# Patient Record
Sex: Female | Born: 1950 | Race: White | Hispanic: No | Smoking: Never smoker
Health system: Southern US, Academic
[De-identification: ages and names within clinical notes are randomized; demographics above are authoritative.]

## PROBLEM LIST (undated history)

## (undated) DIAGNOSIS — I10 Essential (primary) hypertension: Secondary | ICD-10-CM

## (undated) HISTORY — PX: HX KNEE SURGERY: 2100001320

---

## 1995-02-01 ENCOUNTER — Other Ambulatory Visit (HOSPITAL_COMMUNITY): Payer: Self-pay

## 2021-08-06 ENCOUNTER — Other Ambulatory Visit (HOSPITAL_COMMUNITY): Payer: Self-pay | Admitting: Family Medicine

## 2021-08-06 DIAGNOSIS — R410 Disorientation, unspecified: Secondary | ICD-10-CM

## 2021-08-31 IMAGING — MR MRI BRAIN W/O CONTRAST
8 of 9 series · 36 of 48 positions shown · IV contrast (gadolinium)
Comparison: None available.

﻿EXAM:  MRI BRAIN W/O CONTRAST
INDICATION: Headaches, confusion and disorientation.
TECHNIQUE: Multiplanar, multisequential MRI of the brain was performed without gadolinium contrast.

[Series 5: DWI · axial · 5.0mm · 1.35mm/px · z∈[-35,+85]mm · 10 of 87 slices shown (1 of 3)]
[im 6/87]
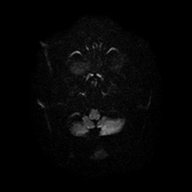
[im 12/87]
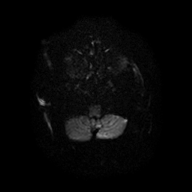
[im 18/87]
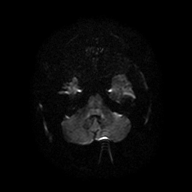
[im 29/87]
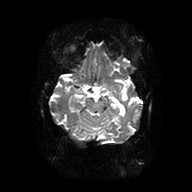
[im 41/87]
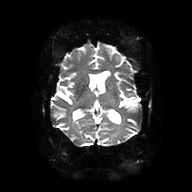
[im 46/87]
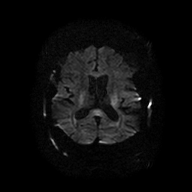
[im 52/87]
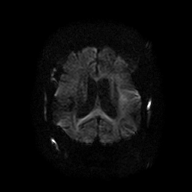
[im 64/87]
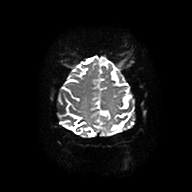
[im 75/87]
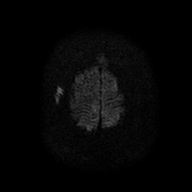
[im 87/87]
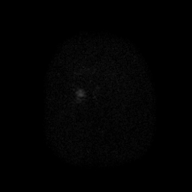

[Series 6: DWI · axial · 5.0mm · 1.35mm/px · z∈[-41,+85]mm · 4 of 22 slices shown (2 of 3)]
[im 1/22]
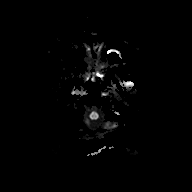
[im 8/22]
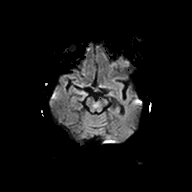
[im 15/22]
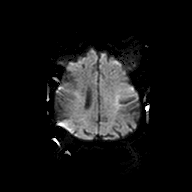
[im 22/22]
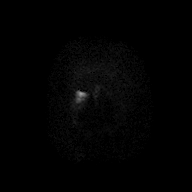

[Series 7: DWI · axial · 5.0mm · 1.35mm/px · z∈[-41,+85]mm · 4 of 22 slices shown (3 of 3)]
[im 1/22]
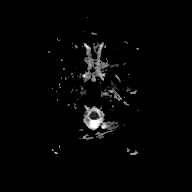
[im 8/22]
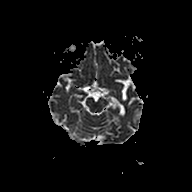
[im 15/22]
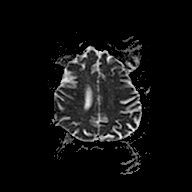
[im 22/22]
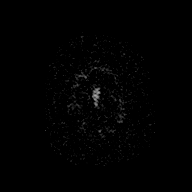

[Series 8: FLAIR · sagittal · 4.0mm · 0.75mm/px · 4 of 26 slices shown (1 of 2)]
[im 1/26]
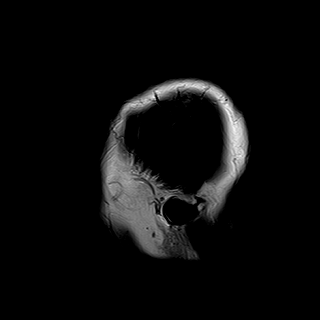
[im 9/26]
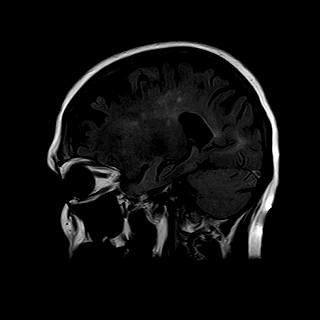
[im 17/26]
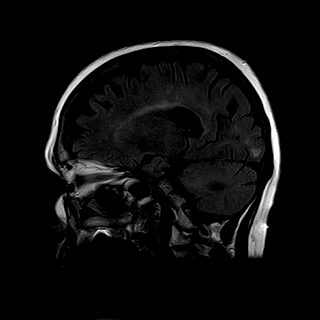
[im 26/26]
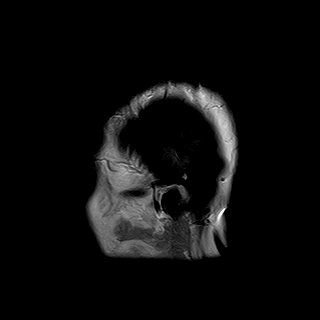

[Series 9: T2 · axial · 5.0mm · 0.43mm/px · z∈[-53,+91]mm · 4 of 25 slices shown (1 of 2)]
[im 1/25]
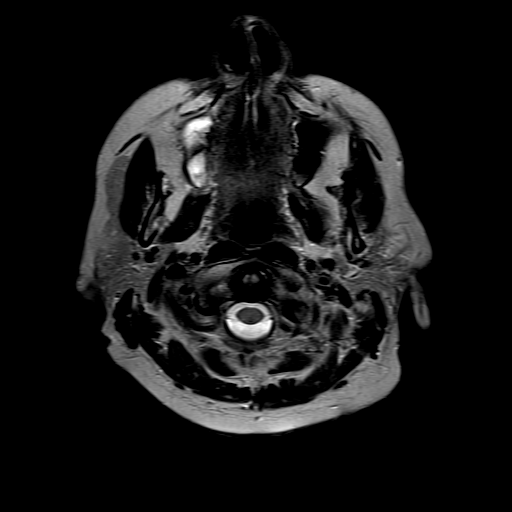
[im 9/25]
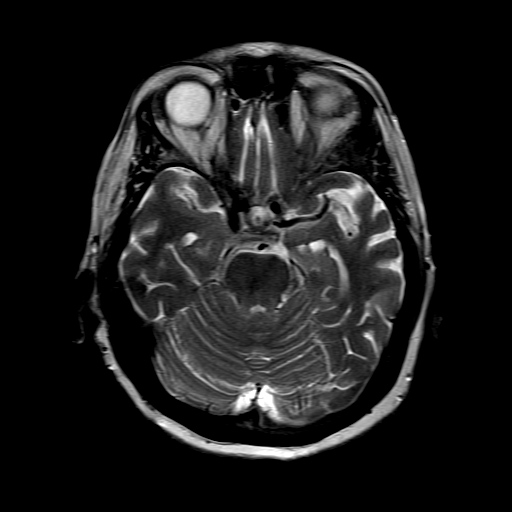
[im 17/25]
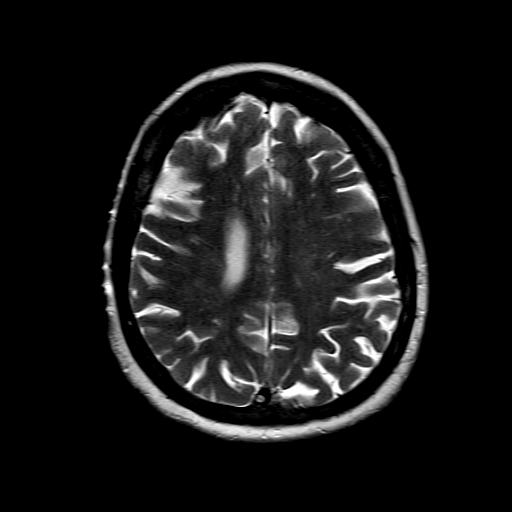
[im 25/25]
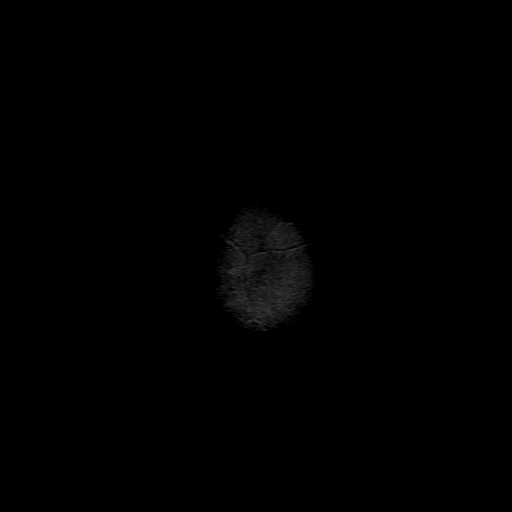

[Series 10: FLAIR · axial · 5.0mm · 0.43mm/px · z∈[-53,+91]mm · 4 of 25 slices shown (2 of 2)]
[im 1/25]
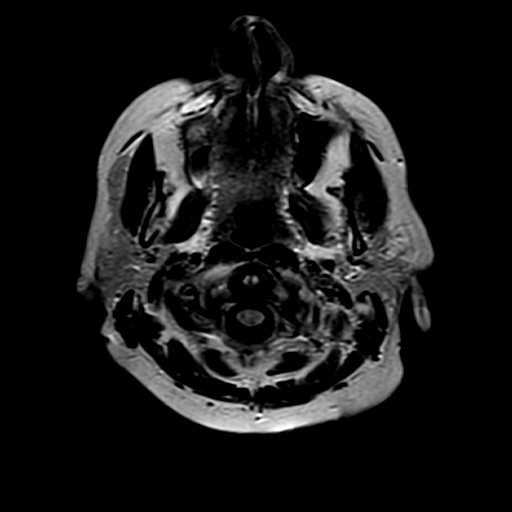
[im 9/25]
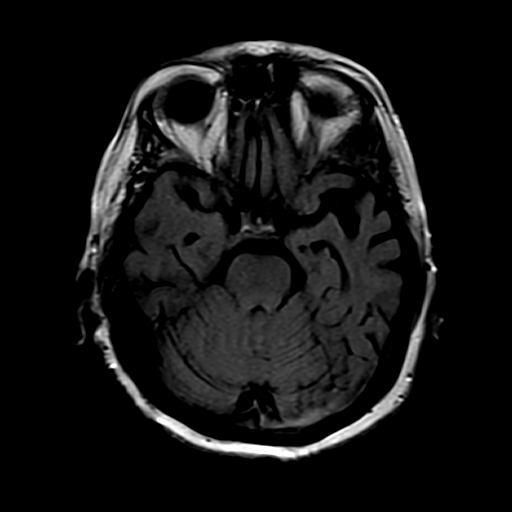
[im 17/25]
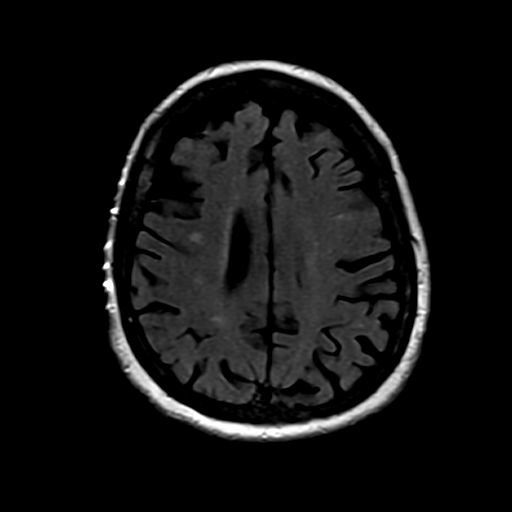
[im 25/25]
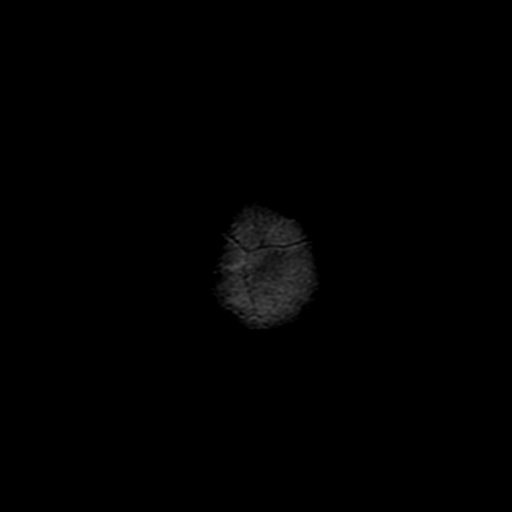

[Series 11: T1 · axial · 5.0mm · 0.43mm/px · z∈[-53,-5]mm · 2 of 25 slices shown]
[im 1/25]
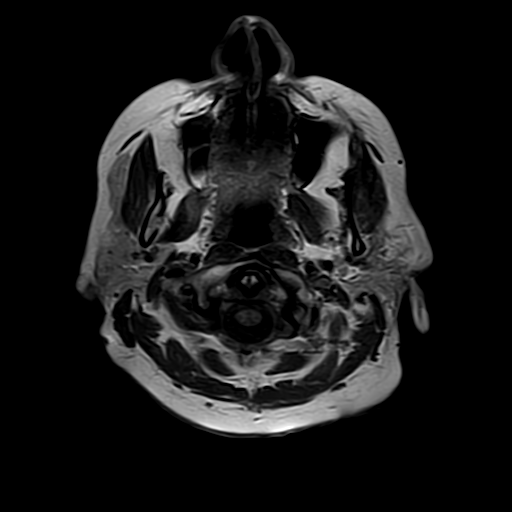
[im 9/25]
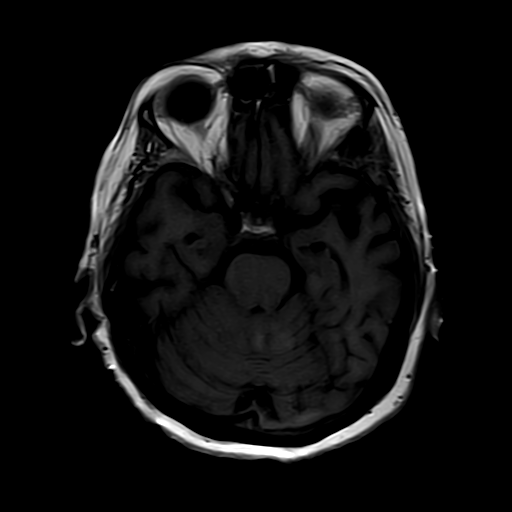

[Series 13: T2 · coronal · 6.0mm · 0.43mm/px · 4 of 24 slices shown (2 of 2)]
[im 1/24]
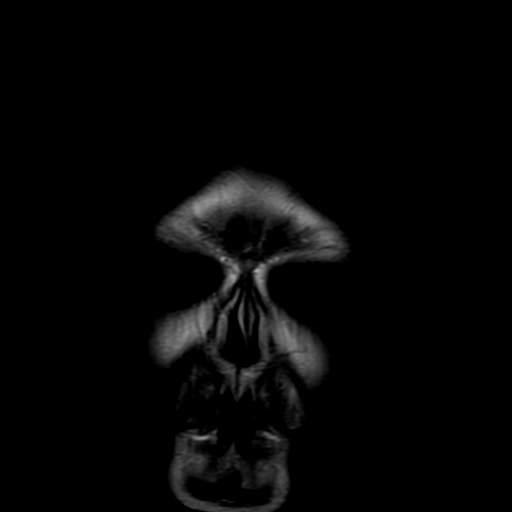
[im 8/24]
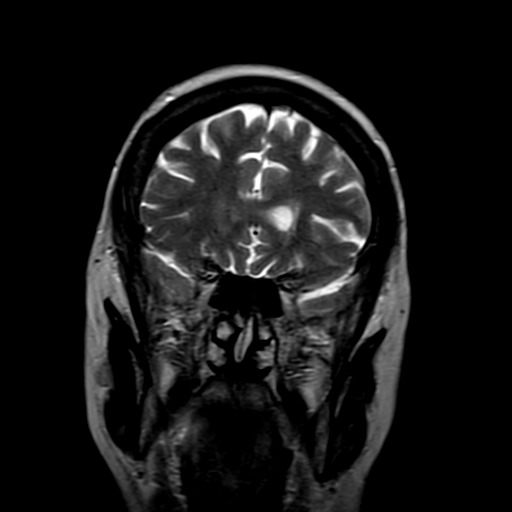
[im 16/24]
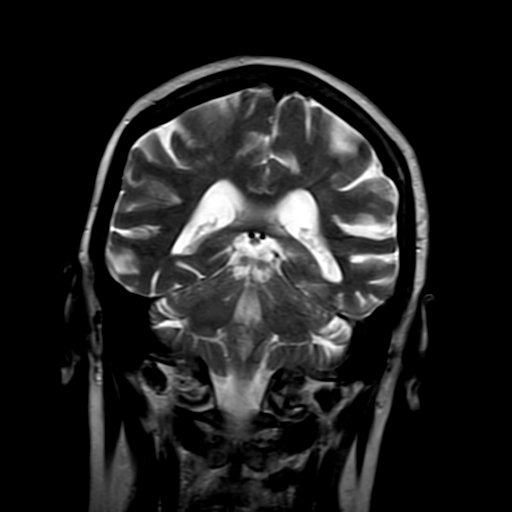
[im 24/24]
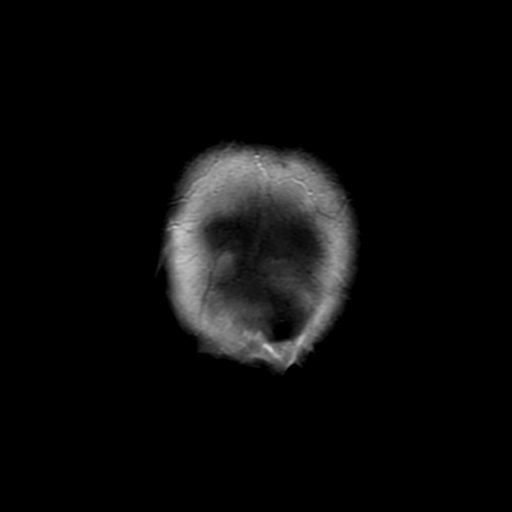

[36 of 48 positions shown; findings below may reference images not displayed]

FINDINGS: Ventricular and sulcal size is normal for the patient's age.  There are mild chronic small vessel ischemic changes. Small right occipital lobe chronic infarct is also seen.  There is no mass effect, midline shift or intracranial hemorrhage. There is no evidence of acute infarction or prior microhemorrhages. Skull base flow voids and basal cisterns are patent. Sagittal survey of midline structures is unremarkable. There are no extra-axial fluid collections.  Right maxillary and bilateral ethmoid sinus mucoperiosteal thickening is seen.  There is also partial opacification of the right mastoid air cells. Orbital contents are unremarkable.
IMPRESSION: Mild chronic small vessel ischemic changes, no acute intracranial abnormality.

## 2021-10-01 ENCOUNTER — Other Ambulatory Visit (HOSPITAL_COMMUNITY): Payer: Self-pay | Admitting: Family Medicine

## 2021-10-01 DIAGNOSIS — Z1239 Encounter for other screening for malignant neoplasm of breast: Secondary | ICD-10-CM

## 2021-11-12 ENCOUNTER — Inpatient Hospital Stay
Admission: EM | Admit: 2021-11-12 | Discharge: 2021-11-14 | DRG: 065 | Disposition: A | Discharge status: Home / Self Care | Payer: Commercial Managed Care - PPO | Attending: HOSPITALIST | Admitting: HOSPITALIST

## 2021-11-12 ENCOUNTER — Emergency Department (HOSPITAL_COMMUNITY): Payer: Commercial Managed Care - PPO

## 2021-11-12 ENCOUNTER — Other Ambulatory Visit: Payer: Self-pay

## 2021-11-12 ENCOUNTER — Inpatient Hospital Stay (HOSPITAL_COMMUNITY): Payer: Commercial Managed Care - PPO

## 2021-11-12 ENCOUNTER — Telehealth (HOSPITAL_BASED_OUTPATIENT_CLINIC_OR_DEPARTMENT_OTHER): Payer: Commercial Managed Care - PPO | Admitting: Neurology

## 2021-11-12 ENCOUNTER — Encounter (HOSPITAL_COMMUNITY): Payer: Self-pay | Admitting: Emergency Medicine

## 2021-11-12 DIAGNOSIS — R4701 Aphasia: Secondary | ICD-10-CM | POA: Diagnosis present

## 2021-11-12 DIAGNOSIS — I6389 Other cerebral infarction: Principal | ICD-10-CM | POA: Diagnosis present

## 2021-11-12 DIAGNOSIS — Z79899 Other long term (current) drug therapy: Secondary | ICD-10-CM

## 2021-11-12 DIAGNOSIS — R7303 Prediabetes: Secondary | ICD-10-CM | POA: Diagnosis present

## 2021-11-12 DIAGNOSIS — R410 Disorientation, unspecified: Secondary | ICD-10-CM

## 2021-11-12 DIAGNOSIS — I639 Cerebral infarction, unspecified: Secondary | ICD-10-CM

## 2021-11-12 DIAGNOSIS — N390 Urinary tract infection, site not specified: Secondary | ICD-10-CM | POA: Diagnosis present

## 2021-11-12 DIAGNOSIS — G459 Transient cerebral ischemic attack, unspecified: Secondary | ICD-10-CM

## 2021-11-12 DIAGNOSIS — I10 Essential (primary) hypertension: Secondary | ICD-10-CM | POA: Diagnosis present

## 2021-11-12 DIAGNOSIS — R29810 Facial weakness: Secondary | ICD-10-CM | POA: Diagnosis present

## 2021-11-12 DIAGNOSIS — G8324 Monoplegia of upper limb affecting left nondominant side: Secondary | ICD-10-CM | POA: Diagnosis present

## 2021-11-12 DIAGNOSIS — R4781 Slurred speech: Secondary | ICD-10-CM

## 2021-11-12 HISTORY — DX: Essential (primary) hypertension: I10

## 2021-11-12 LAB — CBC WITH DIFF
BASOPHIL #: 0 10*3/uL (ref 0.00–0.30)
BASOPHIL %: 1 % (ref 0–3)
EOSINOPHIL #: 0.1 10*3/uL (ref 0.00–0.80)
EOSINOPHIL %: 1 % (ref 0–7)
HCT: 41 % (ref 37.0–47.0)
HGB: 13.6 g/dL (ref 12.5–16.0)
LYMPHOCYTE #: 3.3 10*3/uL (ref 1.10–5.00)
LYMPHOCYTE %: 41 % (ref 25–45)
MCH: 29.6 pg (ref 27.0–32.0)
MCHC: 33.2 g/dL (ref 32.0–36.0)
MCV: 89.3 fL (ref 78.0–99.0)
MONOCYTE #: 0.6 10*3/uL (ref 0.00–1.30)
MONOCYTE %: 8 % (ref 0–12)
MPV: 8.7 fL (ref 7.4–10.4)
NEUTROPHIL #: 3.9 10*3/uL (ref 1.80–8.40)
NEUTROPHIL %: 49 % (ref 40–76)
PLATELETS: 207 10*3/uL (ref 140–440)
RBC: 4.6 10*6/uL (ref 4.20–5.40)
RDW: 14.4 % (ref 11.6–14.8)
WBC: 8 10*3/uL (ref 4.0–10.5)
WBCS UNCORRECTED: 8 10*3/uL

## 2021-11-12 LAB — COMPREHENSIVE METABOLIC PANEL, NON-FASTING
ALBUMIN/GLOBULIN RATIO: 1.3 (ref 0.8–1.4)
ALBUMIN: 4.1 g/dL (ref 3.5–5.7)
ALKALINE PHOSPHATASE: 146 U/L — ABNORMAL HIGH (ref 34–104)
ALT (SGPT): 17 U/L (ref 7–52)
ANION GAP: 7 mmol/L — ABNORMAL LOW (ref 10–20)
AST (SGOT): 19 U/L (ref 13–39)
BILIRUBIN TOTAL: 0.4 mg/dL (ref 0.3–1.2)
BUN/CREA RATIO: 15 (ref 6–22)
BUN: 14 mg/dL (ref 7–25)
CALCIUM, CORRECTED: 8.8 mg/dL — ABNORMAL LOW (ref 8.9–10.8)
CALCIUM: 8.9 mg/dL (ref 8.6–10.3)
CHLORIDE: 104 mmol/L (ref 98–107)
CO2 TOTAL: 27 mmol/L (ref 21–31)
CREATININE: 0.93 mg/dL (ref 0.60–1.30)
ESTIMATED GFR: 67 mL/min/{1.73_m2} (ref >59)
GLOBULIN: 3.2 (ref 2.9–5.4)
GLUCOSE: 109 mg/dL (ref 74–109)
OSMOLALITY, CALCULATED: 277 mOsm/kg (ref 270–290)
POTASSIUM: 4.1 mmol/L (ref 3.5–5.1)
PROTEIN TOTAL: 7.3 g/dL (ref 6.4–8.9)
SODIUM: 138 mmol/L (ref 136–145)

## 2021-11-12 LAB — BLUE TOP TUBE

## 2021-11-12 LAB — URINALYSIS, MACROSCOPIC
BILIRUBIN: NEGATIVE mg/dL
BLOOD: NEGATIVE mg/dL
GLUCOSE: NEGATIVE mg/dL
KETONES: NEGATIVE mg/dL
LEUKOCYTES: 250 WBCs/uL — AB
NITRITE: NEGATIVE
PH: 6.5 (ref 5.0–9.0)
PROTEIN: NEGATIVE mg/dL
SPECIFIC GRAVITY: 1.008 (ref 1.002–1.030)
UROBILINOGEN: NORMAL mg/dL

## 2021-11-12 LAB — URINALYSIS, MICROSCOPIC
BACTERIA: NEGATIVE /hpf
SQUAMOUS EPITHELIAL: 2 /hpf (ref <28)
WBCS: 3 /hpf (ref <6)

## 2021-11-12 LAB — GRAY TOP TUBE

## 2021-11-12 LAB — GOLD TOP TUBE

## 2021-11-12 LAB — LAVENDER TOP TUBE

## 2021-11-12 LAB — LIGHT GREEN TOP TUBE

## 2021-11-12 MED ORDER — ATORVASTATIN 40 MG TABLET
ORAL_TABLET | ORAL | Status: AC
Start: 2021-11-12 — End: 2021-11-12
  Filled 2021-11-12: qty 1

## 2021-11-12 MED ORDER — ATORVASTATIN 40 MG TABLET
40.0000 mg | ORAL_TABLET | Freq: Every evening | ORAL | Status: DC
Start: 2021-11-12 — End: 2021-11-14
  Administered 2021-11-12 – 2021-11-13 (×2): 40 mg via ORAL
  Filled 2021-11-12: qty 1

## 2021-11-12 MED ORDER — ASPIRIN 325 MG TABLET
325.0000 mg | ORAL_TABLET | Freq: Every day | ORAL | Status: DC
Start: 2021-11-13 — End: 2021-11-13
  Administered 2021-11-13: 325 mg via GASTROSTOMY
  Filled 2021-11-12: qty 1

## 2021-11-12 MED ORDER — ALTEPLASE 100 MG INTRAVENOUS SOLUTION
INTRAVENOUS | Status: AC
Start: 2021-11-12 — End: 2021-11-12
  Filled 2021-11-12: qty 100

## 2021-11-12 MED ORDER — SODIUM CHLORIDE 0.9 % (FLUSH) INJECTION SYRINGE
3.0000 mL | INJECTION | INTRAMUSCULAR | Status: DC | PRN
Start: 2021-11-12 — End: 2021-11-14

## 2021-11-12 MED ORDER — SODIUM CHLORIDE 0.9 % (FLUSH) INJECTION SYRINGE
3.0000 mL | INJECTION | Freq: Three times a day (TID) | INTRAMUSCULAR | Status: DC
Start: 2021-11-12 — End: 2021-11-14
  Administered 2021-11-12: 3 mL
  Administered 2021-11-13: 0 mL
  Administered 2021-11-13 (×2): 3 mL
  Administered 2021-11-14: 0 mL

## 2021-11-12 NOTE — ED Triage Notes (Signed)
Reports that she has been experiencing left sided weakness with slurred and repetitive speech. Reports initial onset of symptoms was 1640 this afternoon.

## 2021-11-12 NOTE — Consults (Signed)
Telestroke Consult Note    Kimberly, Pruitt 71 y.o. female  MRN:  [id number]     Date of Admission:  (Not on file)  Date of Birth:  [birth year]  Date of Service:  11/12/2021    Patient/Family consent for Telemedicine- Yes  Location of Consultation:  [facility], [address] [place], [place] [address]  Telestroke Consult Requested by Dr. Joseph Art  Time of Call: 17:17  Time of Return Call: 17:19  Video Connection: Yes  Technical Issues: No      Chief Complaint: Slurred speech and left arm weakness    History of Present Illness:  Kimberly Pruitt is a 71 y.o. female with a history of HTN, HLD who presents to the outside facility due to acute onset of slurred speech and left arm weakness. The patient reports the symptoms were sudden onset at 4:30 PM today, but by this time they have completely resolved. She does report a history of HTN and had run out of medicine recently so suspects her blood pressure was running higher than normal. She has no prior history of stroke and is not on home blood thinners.     Last Known Well: 11/12/21 at 16:30    PHYSICAL EXAMINATION (indirectly performed by observation via teleconference)  The patient is alert and oriented. Question of very subtle left facial droop. No focal weakness or sensory deficits are appreciated.   NIHSS (approximate)= 1        BP: 159/91  Modified Rankin: 0    I personally reviewed the following images.  My findings and interpretations are as follows:  MRI brain : Small acute ischemic stroke, right parietal lobe. Chronic white matter ischemic changes. No evidence of hemorrhage.     Assessment:    1.  Ischemic:  Yes    Patient does have thrombolytic contraindications: rapidly resolving symptoms    Recommendations:  1. IV thrombolytic administration:  No  2. CTA/ Endovascular Evaluation:  no  3.   Transfer for higher level of care:  No    Acute Ischemic Stroke  - patient was in window for thrombolytics, however she agreed to defer administration as symptoms were  nearly resolved at the time of our discussion  - carotid duplexes have been completed, results pending  - recommend TTE with shunt study  - obtain HbA1c, Lipid panel, TSH, AST/ALT  - please start ASA 81 mg daily   - please start atorvastatin 40 mg daily or equivalent intensity statin  - obtain PT/OT and speech/swallow evaluations  - I will be available for follow up tele-consultation if there are any additional questions    Total Consult Time in Minutes: 30    Hillery Hunter, MD  11/12/2021, 20:20

## 2021-11-12 NOTE — H&P (Signed)
[place]    HOSPITALIST H&P    CHRSTINA MOENCH 71 y.o. female ED21/ED21   Date of Service: 11/12/2021    Date of Admission:  11/12/2021   PCP: Peri Maris, DO Code Status:No Order       Chief Complaint: confusion, L arm weakness, facial drooping      HPI:  This 71 year old white female with known history of hypertension, presents hospital service to the ED with left-sided facial drooping, expressive aphasia, difficulty using the left arm, and confusion earlier today that apparently lasted less than 5 minutes.  She did not have any dizziness, headache, change in vision, hearing, taste, or smell.  She states she did not have any numbness or tingling in her arm, but just felt "that it was somebody else's arm".  She did state that her friend told her that her mouth was drooping, but that has since resolved.  She came to ED as a stroke alert, and did go straight MRI, as apparently the CT scanner was down to power outage.  MRI did show: SMALL SUBTLE FOCUS OF RESTRICTED DIFFUSION IN THE RIGHT ANTERIOR PARIETAL WHITE MATTER CONSISTENT WITH AN ACUTE OR SUBACUTE INFARCT.  Patient did have tele neurology visits, they did recommend admission, neuro checks, telemetry monitoring, antiplatelet agent, and statin.  Patient is asymptomatic at this time, but is willing to stay.  She will have echocardiogram, carotid duplex ultrasound, lipid profile in a.m.[name]  She will have neuro checks throughout the night.      ED medications:          PMHx:    Past Medical History:   Diagnosis Date   . Essential hypertension     PSHx:   Past Surgical History:   Procedure Laterality Date   . HX KNEE SURGERY         Allergies:    No Known Allergies Social History  Social History     Tobacco Use   . Smoking status: Never   . Smokeless tobacco: Never   Substance Use Topics   . Alcohol use: Never   . Drug use: Never       Family History  Family Medical History:     Problem Relation (Age of Onset)    Stroke Mother, Sister              Home Meds:      Prior to Admission medications    Not on File          ROS:   General: No fever or chills. No weight changes, fatigue, weakness.   HEENT: No headaches, dizziness, changes in vision, changes in hearing, or difficulty swallowing.    Skin:  No rashes, erythema or bruises.   Cardiac: No chest pain, palpitations, or arrhythmia.    Respiratory: No shortness of breath, cough, or wheezing.  GI: No nausea or vomiting. No abdominal pain.   Urinary: No dysuria, hematuria, or change in frequency.    Vascular: No edema.     Musculoskeletal: No muscle weakness, pain, or decreased range of motion.   Neurologic:  Left facial drooping, weakness left arm, confusion as per HPI   Endocrine: No heat or cold intolerance or polydipsia.   Psychiatric: No insomnia, depression or anxiety.      Results for orders placed or performed during the hospital encounter of 11/12/21 (from the past 24 hour(s))   COMPREHENSIVE METABOLIC PANEL, NON-FASTING   Result Value Ref Range    SODIUM 138  136 - 145 mmol/L    POTASSIUM 4.1 3.5 - 5.1 mmol/L    CHLORIDE 104 98 - 107 mmol/L    CO2 TOTAL 27 21 - 31 mmol/L    ANION GAP 7 (L) 10 - 20 mmol/L    BUN 14 7 - 25 mg/dL    CREATININE 0.93 0.60 - 1.30 mg/dL    BUN/CREA RATIO 15 6 - 22    ESTIMATED GFR 67 >59 mL/min/1.14m^2    ALBUMIN 4.1 3.5 - 5.7 g/dL    CALCIUM 8.9 8.6 - 10.3 mg/dL    GLUCOSE 109 74 - 109 mg/dL    ALKALINE PHOSPHATASE 146 (H) 34 - 104 U/L    ALT (SGPT) 17 7 - 52 U/L    AST (SGOT) 19 13 - 39 U/L    BILIRUBIN TOTAL 0.4 0.3 - 1.2 mg/dL    PROTEIN TOTAL 7.3 6.4 - 8.9 g/dL    ALBUMIN/GLOBULIN RATIO 1.3 0.8 - 1.4    OSMOLALITY, CALCULATED 277 270 - 290 mOsm/kg    CALCIUM, CORRECTED 8.8 (L) 8.9 - 10.8 mg/dL    GLOBULIN 3.2 2.9 - 5.4   CBC WITH DIFF   Result Value Ref Range    WBCS UNCORRECTED 8.0 x10^3/uL    WBC 8.0 4.0 - 10.5 x10^3/uL    RBC 4.60 4.20 - 5.40 x10^6/uL    HGB 13.6 12.5 - 16.0 g/dL    HCT 41.0 37.0 - 47.0 %    MCV 89.3 78.0 - 99.0 fL    MCH 29.6 27.0 - 32.0 pg     MCHC 33.2 32.0 - 36.0 g/dL    RDW 14.4 11.6 - 14.8 %    PLATELETS 207 140 - 440 x10^3/uL    MPV 8.7 7.4 - 10.4 fL    NEUTROPHIL % 49 40 - 76 %    LYMPHOCYTE % 41 25 - 45 %    MONOCYTE % 8 0 - 12 %    EOSINOPHIL % 1 0 - 7 %    BASOPHIL % 1 0 - 3 %    NEUTROPHIL # 3.90 1.80 - 8.40 x10^3/uL    LYMPHOCYTE # 3.30 1.10 - 5.00 x10^3/uL    MONOCYTE # 0.60 0.00 - 1.30 x10^3/uL    EOSINOPHIL # 0.10 0.00 - 0.80 x10^3/uL    BASOPHIL # 0.00 0.00 - 0.30 x10^3/uL          Physical:  Filed Vitals:    11/12/21 1714   BP: (!) 154/91   Pulse: 75   Resp: 18   Temp: 36.1 C (97 F)   SpO2: 98%      General: Patient is alert and oriented to person, place, and time. No acute distress. Communicates appropriately.   Head: Normocephalic and atraumatic.    Eyes: Pupils equally round and react to light and accommodate. Extraocular movements intact.  Conjunctiva normal. Sclerae are normal.    Nose: Nasal passages clear. Mucosa moist.    Throat: Moist oral mucosa. No erythema or exudate of the pharynx. Clear oropharynx.    Neck: Supple. No cervical lymphadenopathy or supraclavicular nodes detected. Trachea midline   Heart: Regular rate and rhythm. S1 & S2 present. No S3 or S4. No rubs, gallops, or murmurs appreciated.   Lungs: Clear to auscultation bilaterally with no wheezes or rales. Equal chest excursion.  No conversational dyspnea. No respiratory distress noted.   Abdomen: Soft, nontender, nondistended belly. Bowel sounds are present in all four quadrants. No rigidity.  No guarding.  No ascites.  Extremities: No edema, cyanosis, or clubbing. Grossly moves all extremities.    Skin: Warm and dry without lesions. No ecchymosis noted.    Neurologic: Cranial nerves II through XII are grossly intact.Strength 5/5 in upper extremities and lower extremities bilaterally.  Finger-to-nose intact.  No pronator drift.  Genitourinary:  No urinary incontinence or Foley catheter   Psychiatric: Judgment and insight are intact. Mood and affect are  appropriate for the situation.       Diagnostic studies:  No results found.       EKG interpretation:     @PEVF @    Assessment:  Active Hospital Problems   (*Primary Problem)    Diagnosis   . *Stroke (cerebrum) (CMS [facility])       Plan:  Patient will be admitted for the above problems.  Patient will have low-dose aspirin started, statin, and we have asked for echocardiogram, carotid ultrasound, and lipid profile for a.m.[name]  She will have q.4 hours neuro checks, and further interventions will be based on clinical course.  Vital signs are stable at time of examination, however we will allow for permissive hypertension at this time.  The hospitalist has examined patient, and reviewed all material, and agrees with the above medical management at this time.      DVT prophylaxis:  ASA     Annamarie Dawley, PA-C    [facility] HOSPITAL[facility]

## 2021-11-12 NOTE — ED Provider Notes (Signed)
[facility]  ED Primary Provider Note  Patient Name: Kimberly Pruitt  Patient Age: 71 y.o.  Date of Birth: Jul 18, 1950    Chief Complaint: Stroke Alert and Confusion        History of Present Illness       Kimberly Pruitt is a 71 y.o. female who had concerns including Stroke Alert and Confusion.  This patient is a 71 year old female who presents with reported difficulty with word-finding, slurred speech, and confusion.  The onset was approximally 4:30 p.m..  The patient is not hypoglycemic per EMS.        Review of Systems     No other overt Review of Systems are noted to be positive except noted in the HPI.      Historical Data   History Reviewed This Encounter: Medical History  Surgical History  Family History  Social History        Physical Exam   ED Triage Vitals [11/12/21 1714]   BP (Non-Invasive) (!) 154/91   Heart Rate 75   Respiratory Rate 18   Temperature 36.1 C (97 F)   SpO2 98 %   Weight 81.6 kg (180 lb)   Height 1.676 m ('5\' 6"'$ )         Nursing notes reviewed for what could be assessed. Past Medical, Surgical, and Social history reviewed. Exam limited in the setting of personal protective equipment.    Constitutional: NAD. Well-Developed. Well Nourished.  Head: Normocephalic, atraumatic.  Mouth/Throat:  Grossly symmetric facial movement.  Eyes: EOM grossly intact, conjunctiva normal.  Neck: Supple  Cardiovascular: Extremities well perfused.  Non tachycardic.  Pulmonary/Chest: No respiratory distress.   Abdominal: Non-distended. No overt peritoneal findings.   MSK: No Lower Extremity Edema.  Skin: Warm, dry, and intact for what is visualized.   Neuro: Appropriate, cranial nerves 2-12 grossly intact.  Pupils appear equal.  No pronator drift.  Patient touches her forehead bilaterally when asked to conduct finger-to-nose testing.  Psych: Pleasant            Procedures      Patient Data     Labs Ordered/Reviewed   COMPREHENSIVE METABOLIC PANEL, NON-FASTING - Abnormal; Notable  for the following components:       Result Value    ANION GAP 7 (*)     ALKALINE PHOSPHATASE 146 (*)     CALCIUM, CORRECTED 8.8 (*)     All other components within normal limits    Narrative:     Estimated Glomerular Filtration Rate (eGFR) is calculated using the CKD-EPI (2021) equation, intended for patients 20 years of age and older. If gender is not documented or "unknown", there will be no eGFR calculation.   CBC/DIFF    Narrative:     The following orders were created for panel order CBC/DIFF.  Procedure                               Abnormality         Status                     ---------                               -----------         ------  CBC WITH IRJJ[[id number]]                                    Final result                 Please view results for these tests on the individual orders.   CBC WITH DIFF   URINALYSIS, MACROSCOPIC AND MICROSCOPIC W/CULTURE REFLEX    Narrative:     The following orders were created for panel order URINALYSIS, MACROSCOPIC AND MICROSCOPIC W/CULTURE REFLEX.  Procedure                               Abnormality         Status                     ---------                               -----------         ------                     URINALYSIS, MACROSCOPIC[[id number]]                                                     URINALYSIS, MICROSCOPIC[[id number]]                                                       Please view results for these tests on the individual orders.   URINALYSIS, MACROSCOPIC   URINALYSIS, MICROSCOPIC   EXTRA TUBES    Narrative:     The following orders were created for panel order EXTRA TUBES.  Procedure                               Abnormality         Status                     ---------                               -----------         ------                     BLUE TOP KZSW[[id number]]                                    In process                 GOLD TOP DUKG[[id number]]                                    In process  LIGHT  GREEN TOP YIFO[[id number]]                             In process                 LAVENDER TOP MVEH[[id number]]                                In process                 GRAY TOP EZMO[[id number]]                                    In process                   Please view results for these tests on the individual orders.   BLUE TOP TUBE   GOLD TOP TUBE   LIGHT GREEN TOP TUBE   LAVENDER TOP TUBE   GRAY TOP TUBE   PERFORM POC WHOLE BLOOD GLUCOSE       XR AP MOBILE CHEST   Final Result by Edi, Radresults In (06/02 1836)   NO ACUTE FINDINGS.         Radiologist location ID: [id number]         MRI BRAIN WO CONTRAST   Final Result by Edi, Radresults In (06/02 1828)   1.SMALL SUBTLE FOCUS OF RESTRICTED DIFFUSION IN THE RIGHT ANTERIOR PARIETAL WHITE MATTER CONSISTENT WITH AN ACUTE OR SUBACUTE INFARCT.   2.NO ACUTE HEMORRHAGE   3.CHRONIC MICROVASCULAR CHANGES IN THE CEREBRAL WHITE MATTER WITH MILD CEREBRAL VOLUME LOSS            Radiologist location ID: [facility] Decision Making          MDM      Studies Assessed:  EKG, lab, radiology    EKG:   This EKG interpreted by me shows:    Rate:  69 per minute    Interpretation:  PR 130, insert a STEMI      MDM Narrative:  This patient is a 71 year old female who presents as a stroke alert.  This patient has reported slurred speech, and difficulty with finger-to-nose.  Due to CT scan not being functional, after discussion with Dr. Lowell Guitar common neurologist on-call common emergent MRI was ordered.  Patient does have findings suggestive of stroke on MRI, however after the scan was completed, the patient's symptoms did resolve.  After the neurologist had risk benefit discussion, it was decided to not proceed with tPA.  Hospitalist team was consulted for admission evaluation.       ED Course as of 11/12/21 1916   Fri Nov 12, 2021   1915 Case discussed with Drue Stager, he will evaluate the patient.              Patient will be admitted to the  service for further  workup and management.    Disposition: Admitted               Clinical Impression   Cerebrovascular accident (CVA), unspecified mechanism (CMS [facility]) (Primary)         There are no discharge medications for this patient.        Wynetta Emery, MD, [facility]  Department of Emergency Medicine

## 2021-11-12 NOTE — ED Nurses Note (Signed)
TeleMed set up in pt room.

## 2021-11-13 ENCOUNTER — Encounter (HOSPITAL_COMMUNITY): Payer: Self-pay | Admitting: Internal Medicine

## 2021-11-13 ENCOUNTER — Inpatient Hospital Stay (HOSPITAL_COMMUNITY): Payer: Commercial Managed Care - PPO

## 2021-11-13 DIAGNOSIS — I639 Cerebral infarction, unspecified: Secondary | ICD-10-CM

## 2021-11-13 DIAGNOSIS — R531 Weakness: Secondary | ICD-10-CM

## 2021-11-13 DIAGNOSIS — I10 Essential (primary) hypertension: Secondary | ICD-10-CM

## 2021-11-13 DIAGNOSIS — Z7982 Long term (current) use of aspirin: Secondary | ICD-10-CM

## 2021-11-13 DIAGNOSIS — N390 Urinary tract infection, site not specified: Secondary | ICD-10-CM

## 2021-11-13 LAB — ECG 12 LEAD
Atrial Rate: 69 {beats}/min
Calculated P Axis: 35 degrees
Calculated R Axis: -27 degrees
Calculated T Axis: 34 degrees
PR Interval: 130 ms
QRS Duration: 84 ms
QT Interval: 392 ms
QTC Calculation: 420 ms
Ventricular rate: 69 {beats}/min

## 2021-11-13 LAB — COMPREHENSIVE METABOLIC PANEL, NON-FASTING
ALBUMIN/GLOBULIN RATIO: 1.2 (ref 0.8–1.4)
ALBUMIN: 4 g/dL (ref 3.5–5.7)
ALKALINE PHOSPHATASE: 112 U/L — ABNORMAL HIGH (ref 34–104)
ALT (SGPT): 16 U/L (ref 7–52)
ANION GAP: 6 mmol/L — ABNORMAL LOW (ref 10–20)
AST (SGOT): 19 U/L (ref 13–39)
BILIRUBIN TOTAL: 0.9 mg/dL (ref 0.3–1.2)
BUN/CREA RATIO: 14 (ref 6–22)
BUN: 10 mg/dL (ref 7–25)
CALCIUM, CORRECTED: 9.3 mg/dL (ref 8.9–10.8)
CALCIUM: 9.3 mg/dL (ref 8.6–10.3)
CHLORIDE: 105 mmol/L (ref 98–107)
CO2 TOTAL: 30 mmol/L (ref 21–31)
CREATININE: 0.74 mg/dL (ref 0.60–1.30)
ESTIMATED GFR: 88 mL/min/{1.73_m2} (ref >59)
GLOBULIN: 3.3 (ref 2.9–5.4)
GLUCOSE: 124 mg/dL — ABNORMAL HIGH (ref 74–109)
OSMOLALITY, CALCULATED: 282 mOsm/kg (ref 270–290)
POTASSIUM: 4.1 mmol/L (ref 3.5–5.1)
PROTEIN TOTAL: 7.3 g/dL (ref 6.4–8.9)
SODIUM: 141 mmol/L (ref 136–145)

## 2021-11-13 LAB — PHOSPHORUS: PHOSPHORUS: 3.4 mg/dL — ABNORMAL LOW (ref 3.7–7.2)

## 2021-11-13 LAB — LIPID PANEL
CHOL/HDL RATIO: 3.4
CHOLESTEROL: 205 mg/dL — ABNORMAL HIGH (ref <200)
HDL CHOL: 61 mg/dL (ref 23–92)
LDL CALC: 125 mg/dL — ABNORMAL HIGH (ref 0–100)
TRIGLYCERIDES: 97 mg/dL (ref <=150)
VLDL CALC: 19 mg/dL (ref 0–50)

## 2021-11-13 LAB — THYROID STIMULATING HORMONE (SENSITIVE TSH): TSH: 1.894 u[IU]/mL (ref 0.450–5.330)

## 2021-11-13 LAB — MAGNESIUM: MAGNESIUM: 2 mg/dL (ref 1.9–2.7)

## 2021-11-13 LAB — LAVENDER TOP TUBE

## 2021-11-13 LAB — HGA1C (HEMOGLOBIN A1C WITH EST AVG GLUCOSE): HEMOGLOBIN A1C: 6.1 % — ABNORMAL HIGH (ref 4.0–6.0)

## 2021-11-13 MED ORDER — ACETAMINOPHEN 325 MG TABLET
650.0000 mg | ORAL_TABLET | ORAL | Status: DC | PRN
Start: 2021-11-13 — End: 2021-11-14
  Administered 2021-11-13: 650 mg via ORAL
  Filled 2021-11-13: qty 2

## 2021-11-13 MED ORDER — SODIUM CHLORIDE 0.9 % INTRAVENOUS PIGGYBACK
2.0000 g | INTRAVENOUS | Status: DC
Start: 2021-11-13 — End: 2021-11-14
  Administered 2021-11-13: 2 g via INTRAVENOUS
  Administered 2021-11-13 – 2021-11-14 (×2): 0 g via INTRAVENOUS
  Administered 2021-11-14: 2 g via INTRAVENOUS
  Filled 2021-11-13 (×2): qty 20

## 2021-11-13 MED ORDER — LISINOPRIL 5 MG TABLET
5.0000 mg | ORAL_TABLET | Freq: Every day | ORAL | Status: DC
Start: 2021-11-13 — End: 2021-11-14
  Administered 2021-11-13 – 2021-11-14 (×2): 5 mg via ORAL
  Filled 2021-11-13 (×2): qty 1

## 2021-11-13 MED ORDER — SODIUM CHLORIDE 0.9 % INJECTION SOLUTION
2.0000 mL | Freq: Once | INTRAVENOUS | Status: DC | PRN
Start: 2021-11-13 — End: 2021-11-14
  Administered 2021-11-13: 4 mL via INTRAVENOUS

## 2021-11-13 MED ORDER — ASPIRIN 81 MG CHEWABLE TABLET
81.0000 mg | CHEWABLE_TABLET | Freq: Every day | ORAL | Status: DC
Start: 2021-11-14 — End: 2021-11-14
  Administered 2021-11-14: 81 mg via ORAL
  Filled 2021-11-13: qty 1

## 2021-11-13 NOTE — ED Nurses Note (Signed)
Patient transferred to room on telemetry. I.V. access in place with saline lock. Care endorsed to floor nurse.

## 2021-11-13 NOTE — Progress Notes (Signed)
[place] MEDICINE [facility]     HOSPITALIST PROGRESS NOTE    Assessment/Plan:    Kimberly Pruitt is a 71 y.o. female who presented to [facility] with Stroke (cerebrum) (CMS HCC).      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  She remains awake, alert and oriented x4.  She has problems finding words.  Also with left upper extremity weakness with strength of 4/5.  Will keep in hospital today as we complete stroke workup which include several imaging studies and somewhat blood test.  Will attempt to optimize her BP as well.  Continue inpatient care for now.    Objective     Vital signs in last 24 hours:  Filed Vitals:    11/13/21 0556 11/13/21 0610 11/13/21 0726 11/13/21 1012   BP:  (!) 151/89 (!) 150/77    Pulse: 55 60 58 70   Resp:  15 18    Temp:  36.7 C (98 F) 36.6 C (97.9 F)    SpO2:  97% 96%        Intake/Output last 3 shifts:  No intake or output data in the 24 hours ending 11/13/21 1156     Physical Exam:    General: Well-developed, well-nourished  Cardiovascular:  Regular rate, normal rhythm, S1 and S2 present, no murmur, no gallop, no rub, normal peripheral perfusion  Respiratory: Clear to auscultation bilaterally, bilateral breath sounds, normal effort  Gastrointestinal: Soft, benign, nondistended, nontender, no mass  Extremities: No gross deformity, no cyanosis or edema  Skin: Warm, dry; no rashes or  lesions    In-Hospital Medications:    Current Facility-Administered Medications   Medication Dose Route Frequency   . acetaminophen (TYLENOL) tablet  650 mg Oral Q4H PRN   . [START ON 11/14/2021] aspirin chewable tablet 81 mg  81 mg Oral Daily   . atorvastatin (LIPITOR) tablet  40 mg Oral QPM   . cefTRIAXone (ROCEPHIN) 2 g in NS 50 mL IVPB minibag  2 g Intravenous Q24H   . NS flush syringe  3 mL Intracatheter Q8HRS   . NS flush syringe  3 mL Intracatheter Q1H PRN          Labs:    Results for orders placed or performed during the hospital encounter of 11/12/21 (from the past 24 hour(s))   COMPREHENSIVE METABOLIC PANEL, NON-FASTING    Collection Time: 11/12/21  5:15 PM   Result Value Ref Range    SODIUM 138 136 - 145 mmol/L    POTASSIUM 4.1 3.5 - 5.1 mmol/L    CHLORIDE 104 98 - 107 mmol/L    CO2 TOTAL 27 21 - 31 mmol/L    ANION GAP 7 (L) 10 - 20 mmol/L  BUN 14 7 - 25 mg/dL    CREATININE 9.67 8.93 - 1.30 mg/dL    BUN/CREA RATIO 15 6 - 22    ESTIMATED GFR 67 >59 mL/min/1.49m^2    ALBUMIN 4.1 3.5 - 5.7 g/dL    CALCIUM 8.9 8.6 - 81.0 mg/dL    GLUCOSE 175 74 - 102 mg/dL    ALKALINE PHOSPHATASE 146 (H) 34 - 104 U/L    ALT (SGPT) 17 7 - 52 U/L    AST (SGOT) 19 13 - 39 U/L    BILIRUBIN TOTAL 0.4 0.3 - 1.2 mg/dL    PROTEIN TOTAL 7.3 6.4 - 8.9 g/dL    ALBUMIN/GLOBULIN RATIO 1.3 0.8 - 1.4    OSMOLALITY, CALCULATED 277 270 - 290 mOsm/kg    CALCIUM, CORRECTED 8.8 (L) 8.9 - 10.8 mg/dL    GLOBULIN 3.2 2.9 - 5.4   CBC WITH DIFF    Collection Time: 11/12/21  5:15 PM   Result Value Ref Range    WBCS UNCORRECTED 8.0 x10^3/uL    WBC 8.0 4.0 - 10.5 x10^3/uL    RBC 4.60 4.20 - 5.40 x10^6/uL    HGB 13.6 12.5 - 16.0 g/dL    HCT 58.5 27.7 - 82.4 %    MCV 89.3 78.0 - 99.0 fL    MCH 29.6 27.0 - 32.0 pg    MCHC 33.2 32.0 - 36.0 g/dL    RDW 23.5 36.1 - 44.3 %    PLATELETS 207 140 - 440 x10^3/uL    MPV 8.7 7.4 - 10.4 fL    NEUTROPHIL % 49 40 - 76 %    LYMPHOCYTE % 41 25 - 45 %    MONOCYTE % 8 0 - 12 %    EOSINOPHIL % 1 0 - 7 %    BASOPHIL % 1 0  - 3 %    NEUTROPHIL # 3.90 1.80 - 8.40 x10^3/uL    LYMPHOCYTE # 3.30 1.10 - 5.00 x10^3/uL    MONOCYTE # 0.60 0.00 - 1.30 x10^3/uL    EOSINOPHIL # 0.10 0.00 - 0.80 x10^3/uL    BASOPHIL # 0.00 0.00 - 0.30 x10^3/uL   ECG 12 LEAD    Collection Time: 11/12/21  5:19 PM   Result Value Ref Range    Ventricular rate 69 BPM    Atrial Rate 69 BPM    PR Interval 130 ms    QRS Duration 84 ms    QT Interval 392 ms    QTC Calculation 420 ms    Calculated P Axis 35 degrees    Calculated R Axis -27 degrees    Calculated T Axis 34 degrees   BLUE TOP TUBE    Collection Time: 11/12/21  5:36 PM   Result Value Ref Range    RAINBOW/EXTRA TUBE AUTO RESULT Yes    GOLD TOP TUBE    Collection Time: 11/12/21  5:36 PM   Result Value Ref Range    RAINBOW/EXTRA TUBE AUTO RESULT Yes    LIGHT GREEN TOP TUBE    Collection Time: 11/12/21  5:36 PM   Result Value Ref Range    RAINBOW/EXTRA TUBE AUTO RESULT Yes    LAVENDER TOP TUBE    Collection Time: 11/12/21  5:36 PM   Result Value Ref Range    RAINBOW/EXTRA TUBE AUTO RESULT Yes    GRAY TOP TUBE    Collection Time: 11/12/21  5:36 PM   Result Value Ref Range    RAINBOW/EXTRA TUBE AUTO RESULT Yes  URINE CULTURE,ROUTINE    Collection Time: 11/12/21  9:40 PM    Specimen: Urine, Clean Catch   Result Value Ref Range    URINE CULTURE No Growth    URINALYSIS, MACROSCOPIC    Collection Time: 11/12/21  9:40 PM   Result Value Ref Range    COLOR Yellow Colorless, Light Yellow, Yellow    APPEARANCE Clear Clear    SPECIFIC GRAVITY 1.008 1.002 - 1.030    PH 6.5 5.0 - 9.0    LEUKOCYTES 250 (A) Negative, 100  WBCs/uL    NITRITE Negative Negative    PROTEIN Negative Negative, 10 , 20  mg/dL    GLUCOSE Negative Negative, 30  mg/dL    KETONES Negative Negative, Trace mg/dL    BILIRUBIN Negative Negative, 0.5 mg/dL    BLOOD Negative Negative, 0.03 mg/dL    UROBILINOGEN Normal Normal mg/dL   URINALYSIS, MICROSCOPIC    Collection Time: 11/12/21  9:40 PM   Result Value Ref Range    BACTERIA Negative Negative /hpf    RBCS       WBCS 3 <6 /hpf    SQUAMOUS EPITHELIAL 2 <28 /hpf   COMPREHENSIVE METABOLIC PANEL, NON-FASTING    Collection Time: 11/13/21  6:51 AM   Result Value Ref Range    SODIUM 141 136 - 145 mmol/L    POTASSIUM 4.1 3.5 - 5.1 mmol/L    CHLORIDE 105 98 - 107 mmol/L    CO2 TOTAL 30 21 - 31 mmol/L    ANION GAP 6 (L) 10 - 20 mmol/L    BUN 10 7 - 25 mg/dL    CREATININE 1.610.74 0.960.60 - 1.30 mg/dL    BUN/CREA RATIO 14 6 - 22    ESTIMATED GFR 88 >59 mL/min/1.2566m^2    ALBUMIN 4.0 3.5 - 5.7 g/dL    CALCIUM 9.3 8.6 - 04.510.3 mg/dL    GLUCOSE 409124 (H) 74 - 109 mg/dL    ALKALINE PHOSPHATASE 112 (H) 34 - 104 U/L    ALT (SGPT) 16 7 - 52 U/L    AST (SGOT) 19 13 - 39 U/L    BILIRUBIN TOTAL 0.9 0.3 - 1.2 mg/dL    PROTEIN TOTAL 7.3 6.4 - 8.9 g/dL    ALBUMIN/GLOBULIN RATIO 1.2 0.8 - 1.4    OSMOLALITY, CALCULATED 282 270 - 290 mOsm/kg    CALCIUM, CORRECTED 9.3 8.9 - 10.8 mg/dL    GLOBULIN 3.3 2.9 - 5.4   MAGNESIUM    Collection Time: 11/13/21  6:51 AM   Result Value Ref Range    MAGNESIUM 2.0 1.9 - 2.7 mg/dL   PHOSPHORUS    Collection Time: 11/13/21  6:51 AM   Result Value Ref Range    PHOSPHORUS 3.4 (L) 3.7 - 7.2 mg/dL   LIPID PANEL    Collection Time: 11/13/21  6:51 AM   Result Value Ref Range    CHOLESTEROL 205 (H) <200 mg/dL    TRIGLYCERIDES 97 <=811<=150 mg/dL    HDL CHOL 61 23 - 92 mg/dL    LDL CALC 914125 (H) 0 - 100 mg/dL    VLDL CALC 19 0 - 50 mg/dL    CHOL/HDL RATIO 3.4    THYROID STIMULATING HORMONE (SENSITIVE TSH)    Collection Time: 11/13/21  6:51 AM   Result Value Ref Range    TSH 1.894 0.450 - 5.330 uIU/mL   HGA1C (HEMOGLOBIN A1C WITH EST AVG GLUCOSE)    Collection Time: 11/13/21  7:27 AM   Result Value Ref Range  HEMOGLOBIN A1C 6.1 (H) 4.0 - 6.0 %       Micro:    Hospital Encounter on 11/12/21 (from the past 24 hour(s))   URINE CULTURE,ROUTINE    Collection Time: 11/12/21  9:40 PM    Specimen: Urine, Clean Catch   Culture Result Status    URINE CULTURE No Growth Preliminary        Imaging:    ECG 12 LEAD  Normal sinus rhythm  Low voltage  QRS  Borderline ECG  No previous ECGs available  Confirmed by Haig Prophet ([id number]) on 11/13/2021 9:47:16 AM         Audrea Muscat, MD  November 13, 2021    I personally spent 50 minutes face-to-face and non-face-to-face in the care of this patient, which includes all pre, intra, and post visit time on the date of service.  All documented time was specific to the E/M visit and does not include any procedures that may have been performed.

## 2021-11-13 NOTE — Care Plan (Signed)
Patient admitted with Stroke cerebrum. Patient is alert and oriented on room air. Weakness in left hand. Vitals assessed Q4 and PRN. Pain assessed Q2 and PRN. Labs drawn per orders. Fall precautions in place. No deficits other than weakness in left hand. Patient is scheduled to have a TEE today, and carotid ultrasound today. All questions and concerns answered at this time. Son is at bedside.     Problem: Stroke, Ischemic (Includes Transient Ischemic Attack)  Goal: Optimal Coping  Outcome: Ongoing (see interventions/notes)  Intervention: Support Psychosocial Response to Stroke  Recent Flowsheet Documentation  Taken 11/13/2021 0631 by Edwin Cap, RN  Family/Support System Care:   self-care encouraged   support provided  Supportive Measures:   active listening utilized   verbalization of feelings encouraged   self-responsibility promoted   self-reflection promoted   self-care encouraged   positive reinforcement provided  Goal: Effective Bowel Elimination  Outcome: Ongoing (see interventions/notes)  Goal: Optimal Cerebral Tissue Perfusion  Outcome: Ongoing (see interventions/notes)  Intervention: Protect and Optimize Cerebral Perfusion  Recent Flowsheet Documentation  Taken 11/13/2021 0631 by Edwin Cap, RN  Cerebral Perfusion Promotion: blood pressure monitored  Goal: Optimal Cognitive Function  Outcome: Ongoing (see interventions/notes)  Goal: Improved Communication Skills  Outcome: Ongoing (see interventions/notes)  Intervention: Teacher, adult education  Recent Flowsheet Documentation  Taken 11/13/2021 0631 by Edwin Cap, RN  Communication Enhancement Strategies:   call light answered in person   family involved in communication plan  Goal: Optimal Functional Ability  Outcome: Ongoing (see interventions/notes)  Intervention: Optimize Functional Ability  Recent Flowsheet Documentation  Taken 11/13/2021 0631 by Edwin Cap, RN  Activity Management:   activity adjusted per tolerance   activity clustered  for rest periods   activity encouraged   ROM, active encouraged   ambulated in room  Goal: Effective Oxygenation and Ventilation  Outcome: Ongoing (see interventions/notes)  Goal: Improved Sensorimotor Function  Outcome: Ongoing (see interventions/notes)  Intervention: Optimize Sensory and Perceptual Ability  Recent Flowsheet Documentation  Taken 11/13/2021 0631 by Edwin Cap, RN  Pressure Reduction Techniques: frequent weight shift encouraged  Pressure Reduction Devices: (M,H,VH) Use Repositioning Devices or Pillows  Goal: Optimal Eating and Swallowing without Aspiration  Outcome: Ongoing (see interventions/notes)  Goal: Effective Urinary Elimination  Outcome: Ongoing (see interventions/notes)     Problem: Pain Acute  Goal: Optimal Pain Control and Function  Outcome: Ongoing (see interventions/notes)  Intervention: Optimize Psychosocial Wellbeing  Recent Flowsheet Documentation  Taken 11/13/2021 0631 by Edwin Cap, RN  Supportive Measures:   active listening utilized   verbalization of feelings encouraged   self-responsibility promoted   self-reflection promoted   self-care encouraged   positive reinforcement provided     Problem: Fall Injury Risk  Goal: Absence of Fall and Fall-Related Injury  Outcome: Ongoing (see interventions/notes)  Intervention: Promote Injury-Free Environment  Recent Flowsheet Documentation  Taken 11/13/2021 0631 by Edwin Cap, RN  Safety Promotion/Fall Prevention:   activity supervised   fall prevention program maintained   nonskid shoes/slippers when out of bed   safety round/check completed

## 2021-11-13 NOTE — Care Plan (Signed)
Admit for Stroke  Treatment: Neuro assessments, vitals, Cardio monitoring.  Tolerates well    Problem: Stroke, Ischemic (Includes Transient Ischemic Attack)  Goal: Optimal Coping  Outcome: Ongoing (see interventions/notes)  Goal: Effective Bowel Elimination  Outcome: Ongoing (see interventions/notes)  Goal: Optimal Cerebral Tissue Perfusion  Outcome: Ongoing (see interventions/notes)  Goal: Optimal Cognitive Function  Outcome: Ongoing (see interventions/notes)  Goal: Improved Communication Skills  Outcome: Ongoing (see interventions/notes)  Intervention: Teacher, adult education  Recent Flowsheet Documentation  Taken 11/13/2021 0800 by Verta Ellen, RN  Communication Enhancement Strategies: call light answered in person  Goal: Optimal Functional Ability  Outcome: Ongoing (see interventions/notes)  Goal: Effective Oxygenation and Ventilation  Outcome: Ongoing (see interventions/notes)  Goal: Improved Sensorimotor Function  Outcome: Ongoing (see interventions/notes)  Goal: Optimal Eating and Swallowing without Aspiration  Outcome: Ongoing (see interventions/notes)  Goal: Effective Urinary Elimination  Outcome: Ongoing (see interventions/notes)     Problem: Pain Acute  Goal: Optimal Pain Control and Function  Outcome: Ongoing (see interventions/notes)     Problem: Pain Acute  Goal: Optimal Pain Control and Function  Outcome: Ongoing (see interventions/notes)     Problem: Fall Injury Risk  Goal: Absence of Fall and Fall-Related Injury  Outcome: Ongoing (see interventions/notes)

## 2021-11-14 DIAGNOSIS — R7303 Prediabetes: Secondary | ICD-10-CM

## 2021-11-14 LAB — URINE CULTURE,ROUTINE: URINE CULTURE: NO GROWTH

## 2021-11-14 MED ORDER — NITROFURANTOIN MONOHYDRATE/MACROCRYSTALS 100 MG CAPSULE
100.0000 mg | ORAL_CAPSULE | Freq: Two times a day (BID) | ORAL | 0 refills | Status: AC
Start: 2021-11-14 — End: 2021-11-19

## 2021-11-14 MED ORDER — ATORVASTATIN 40 MG TABLET
40.0000 mg | ORAL_TABLET | Freq: Every evening | ORAL | 0 refills | Status: AC
Start: 2021-11-14 — End: 2021-12-14

## 2021-11-14 MED ORDER — LISINOPRIL 5 MG TABLET
5.0000 mg | ORAL_TABLET | Freq: Every day | ORAL | 0 refills | Status: AC
Start: 2021-11-15 — End: 2021-12-15

## 2021-11-14 MED ORDER — ASPIRIN 81 MG CHEWABLE TABLET
81.0000 mg | CHEWABLE_TABLET | Freq: Every day | ORAL | 0 refills | Status: AC
Start: 2021-11-15 — End: 2021-12-15

## 2021-11-14 NOTE — PT Evaluation (Signed)
[facility]  [place], [address]  [phone]  [phone]  Rehabilitation Services  Physical Therapy Inpatient Initial Evaluation    Patient Name: Kimberly Pruitt  Date of Birth: 1950/10/30  Height: Height: 172.7 cm (5' 7.99")  Weight: Weight: 83.6 kg (184 lb 5 oz)  Room/Bed: 301/A  Payor: [employer] / Plan: [place] / Product Type: Non Managed Care /       PMH:  Past Medical History:   Diagnosis Date    Essential hypertension            Assessment:     Ms. Cornia demonstrates good strength, balance, ROM, and activity tolerance, and is independent with all therapy tasks. 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 She does not require skilled PT services at this time, but may benefit from an outpatient SLP evaluation. PT signing off.      Discharge Needs:     Equipment Recommendation: none anticipated    Discharge Disposition: home with assist  JUSTIFICATION OF DISCHARGE RECOMMENDATION   Based on current diagnosis, functional performance prior to admission, and current functional performance, this patient does not require continued PT services.  Due to her high level of function, PT recommends home with assist as needed.    Plan:   Current Intervention: patient/family education  To provide physical therapy services  (one time visit)  for duration of evaluation only.    The risks/benefits of therapy have been discussed with the patient/caregiver and he/she is in agreement with the established plan of care.       Subjective & Objective     Past Medical History:   Diagnosis Date    Essential hypertension      Past Surgical History:   Procedure Laterality Date    HX KNEE SURGERY          11/13/21 1706   Rehab Session   Document Type evaluation   Total PT Minutes: 19   Patient Effort excellent   Symptoms Noted During/After Treatment none   General Information   Patient Profile  Reviewed yes   Pertinent History of Current Functional Problem Patient admitted for ischemic CVA of R parietal lobe, initially with L sided weakness and slurred speech which has resolved. Minor sequencing deficits, comprehension of new tasks, and apraxia noted. Patient also with acute UTI.   Medical Lines Telemetry;PIV Line   Respiratory Status room air   General Observations of Patient Patient agreeable to PT. Patient reclined in bed upon approach, NAD, friend present in room. At departure patient seated EOB, needs/call light in reach, NAD, friend who is a PTA present.   Mutuality/Individual Preferences   Individualized Care Needs PRN assistance   Patient-Specific Goals (Include Timeframe) Go home soon   Plan of Care Reviewed With patient;friend   Living Environment   Lives With alone   Living Arrangements house   Living Environment Comment Patient lives in single story home, no STE. Bathroom has a tub/shower, standard toilet.   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 Does not have DME, but does have a Life Alert  since she is alone.   Pre Treatment Status   Pre Treatment Patient Status Nurse approved session   Support Present Pre Treatment  Sitter present  (Friend present)   Communication Pre Treatment  Nurse   Communication Pre Treatment Comment RN cleared patient for PT   Cognitive Assessment/Interventions   Behavior/Mood Observations behavior appropriate to situation, WNL/WFL   Orientation Status oriented x 4   Attention WNL/WFL   Follows Commands follows one step commands;WFL;follows multi-step commands;increased processing time needed;physical/tactile prompts required;repetition of directions required;verbal  cues/prompting required   Comment Difficulty sequencing new/unfamiliar tasks particularly with coordination testing; unable to complete heel/shin test, significant difficulty with finger/thumb opposition but other tests such as RAMS (BUE/BLE) and finger/nose were normal. When discussing her condition/history, she had word finding difficulty although speech was clear. Good ability to dual/multi-task with familiar activities including gait.   Vital Signs   Pre-Treatment Heart Rate (beats/min) 77   Post-treatment Heart Rate (beats/min) 79   Pre SpO2 (%) 94   O2 Delivery Pre Treatment room air   Post SpO2 (%) 96   O2 Delivery Post Treatment room air   Pain Assessment   Pre/Posttreatment Pain Comment Patient denied pain   Vision Assessment/Interventions   Visual Impairment/Limitations [facility]   General Extremity Assessment   Comment strength 4+>5/5 throughout; equal on both sides   RUE Assessment   RUE Assessment WFL- Within Functional Limits   LUE Assessment   LUE Assessment WFL- Within Functional Limits   RLE Assessment   RLE Assessment WFL- Within Functional Limits   LLE Assessment   LLE Assessment WFL- Within Functional Limits   Trunk Assessment   Trunk Assessment WFL-Within Functional Limits   Bed Mobility Assessment/Treatment   Supine-Sit Independence independent   Sit to Supine, Independence independent   Transfer Assessment/Treatment   Sit-Stand Independence independent   Stand-Sit Independence independent   Gait Assessment/Treatment   Total Distance Ambulated 250   Independence  independent   Gait Speed normal   Deviations    (no significant deviations; WFL)   Balance Skill Training   Comment Normal balance with all tasks   Therapeutic Exercise/Activity   Comment PT educated patient on exam findings and how that correlates to the area in the brain of the stroke. PT educated patient to be aware that some unfamiliar tasks may take extra time to complete and that she may have difficulty comprehending new information  and how to perform unfamiliar tasks. PT educated patient on safety and to contact her physician in order to obtain PT services in the future. PT advised that she could benefit from outpatient speech for her cognitive/language deficits. Patient and friend expressed understanding and all questions were answered to their satisfaction.   Post Treatment Status   Post Treatment Patient Status Patient sitting in bedside chair or w/c;Call light within reach;Telephone within reach   Support Present Post Treatment  Other (See comments)  (Friend present)   Communication Post Treatement Nurse   Communication Post Treatment Comment RN alerted of patient condition and performance with PT.   Plan of Care Review   Plan Of Care Reviewed With patient;friend   Physical Therapy Clinical Impression   Assessment Ms. Noyes demonstrates good strength, balance, ROM, and activity tolerance, and is independent with all therapy tasks. 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 She does not require skilled PT services at this time, but may benefit from an outpatient SLP evaluation. PT signing off.  Patient/Family Goals Statement "I hope to go home today. I'm ready to get back to my normal routine."   Criteria for Skilled Therapeutic no problems identified which require skilled intervention   Rehab Potential good   Therapy Frequency   (one time visit)   Predicted Duration of Therapy Intervention (days/wks) evaluation only   Anticipated Equipment Needs at Discharge (PT) none anticipated   Anticipated Discharge Disposition home with assist   Referral Needed to Another Service speech language pathology   Care Plan Goals   PT Rehab Goals Physical Therapy Goal   Physical Therapy Goal   PT  Goal, Date Established 11/13/21   PT Goal, Time to Achieve 1 day   PT Goal, Activity Type Education: Patient to express understanding of safety and to contact her physician in order to obtain any  future needed PT services.   PT Goal, Outcome goal met   Planned Therapy Interventions, PT Eval   Planned Therapy Interventions (PT) patient/family education   Physical Therapy Discharge Summary   Additional Documentation Discharge Summary (PT) (Group)   Discharge Summary, PT Eval   Reason for Discharge no further needs identified         INTERVENTION MINUTES: EVALUATION 19 minutes    EVALUATION COMPLEXITY : CLINICAL DECISION MAKING OF LOW COMPLEXITY AS INDICATED BY PMH, PHYSICAL THERAPY ASSESSMENT OF MUSCULOSKELETAL AND NEUROLOGICAL SYSTEMS AND ACTIVITY LIMITATIONS. CLINICAL PRESENTATION IS STABLE AND UNCOMPLICATED      Therapist:     Karrie Doffing PT, DPT

## 2021-11-14 NOTE — Nurses Notes (Signed)
Patient discharged home with family.  AVS reviewed with patient/care giver.  A written copy of the AVS and discharge instructions was given to the patient/care giver.  Questions sufficiently answered as needed.  Patient/care giver encouraged to follow up with PCP as indicated.  In the event of an emergency, patient/care giver instructed to call 911 or go to the nearest emergency room.

## 2021-11-14 NOTE — Discharge Summary (Signed)
Kimberly Pruitt     HOSPITALIST DISCHARGE SUMMARY            Identifying Information:   Kimberly Pruitt  71 y.o.  female  10-Nov-1950  [id number]    Primary Care Physician: Peri Maris, DO     Admit Date: 11/12/2021    Discharge Date: 11/14/2021     Discharge To: Home discharge    Discharge Service: Inpatient Hopitalist Service    Discharge Attending Physician: Virl Cagey, MD       Discharge Diagnoses:    PRIMARY DIAGNOSIS       SECONDARY DIAGNOSES                LOS: 2 days          HPI:  This 71 year old white female with known history of hypertension, presents hospital service to the ED with left-sided facial drooping, expressive aphasia, difficulty using the left arm, and confusion earlier today that apparently lasted less than 5 minutes.  She did not have any dizziness, headache, change in vision, hearing, taste, or smell.  She states she did not have any numbness or tingling in her arm, but just felt "that it was somebody else's arm".  She did state that her friend told her that her mouth was drooping, but that has since resolved.  She came to ED as a stroke alert, and did go straight MRI, as apparently the CT scanner was down to power outage.  MRI did show: SMALL SUBTLE FOCUS OF RESTRICTED DIFFUSION IN THE RIGHT ANTERIOR PARIETAL WHITE MATTER CONSISTENT WITH AN ACUTE OR SUBACUTE INFARCT.  Patient did have tele neurology visits, they did recommend admission, neuro checks, telemetry monitoring, antiplatelet agent, and statin.  Patient is asymptomatic at this time, but is willing to stay.  She will have echocardiogram, carotid duplex ultrasound, lipid profile in a.m.[name]  She will have neuro checks throughout the night.    Hospital Course:     [name] is a 71 y.o. female who presented to [facility] with Stroke (cerebrum) (CMS [facility]).      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  Neurology consultation greatly appreciated   Continue aspirin and statin; Rx issued  Total cholesterol: 205   LDL: 125  Echocardiogram showed normal ejection fraction and LV function   Carotid Dopplers showing negative for any significant carotid artery disease  EKG was devoid of any acute ischemic changes and showed normal sinus rhythm  No aberrant rhythms on telemetry  Worked well with PT/OT/SLP    Pre diabetes   A1c of 6.1 on 11/13/2021  Recommend diabetic diet and increase exercise  Follow up with outpatient physician    Hypertension  SBP goal of <130  Titrate BP meds  Rx issued for Lisinopril 5 mg daily     Acute UTI  Completed Antibiotic therapy with ceftriaxone x2 days   Urine cultures were negative  Rx issued for Macrobid x 5 days    Consults:  Neurology      Procedures:  No admission procedures for hospital encounter.        Outpatient Provider Follow Up Issues:   Lifestyle changes        ______________________________________________________________________  Physical Exam:    Vital Signs:  BP 133/78   Pulse 69   Temp 36.6 C (97.9 F)   Resp 18   Ht  1.727 m (5' 7.99")   Wt 83.5 kg (184 lb 1 oz)   SpO2 95%   BMI 27.99 kg/m          Exam:  General: Well-developed, well-nourished  Cardiovascular:  Regular rate, normal rhythm, S1 and S2 present, no murmur, no gallop, no rub, normal peripheral perfusion  Respiratory: Clear to auscultation bilaterally, bilateral breath sounds, normal effort  Gastrointestinal: Soft, benign, nondistended, nontender, no mass  Extremities: No gross deformity, no cyanosis or edema  Skin: Warm, dry; no rashes or lesions    ______________________________________________________________________  Discharge Medications:     Current Discharge Medication List      START taking these medications.      Details   aspirin 81 mg Tablet, Chewable  Start taking on: November 15, 2021   81 mg, Oral, DAILY  Qty: 30 Tablet  Refills: 0     atorvastatin 40 mg Tablet  Commonly known as:  Lipitor   40 mg, Oral, EVERY EVENING  Qty: 30 Tablet  Refills: 0     lisinopriL 5 mg Tablet  Commonly known as: PRINIVIL  Start taking on: November 15, 2021   5 mg, Oral, DAILY  Qty: 30 Tablet  Refills: 0     nitrofurantoin monohyd/m-cryst 100 mg Capsule  Commonly known as: Macrobid   100 mg, Oral, 2 TIMES DAILY  Qty: 10 Capsule  Refills: 0             Allergies:  No Known Allergies   ______________________________________________________________________    Most Recent Labs:  No results found for this or any previous visit (from the past 24 hour(s)).     Relevant Studies/Radiology (if blank, then none):  TRANSTHORACIC ECHOCARDIOGRAM - ADULT    Result Date: 11/13/2021  **See full report in linked PDF document**                                                                                                                                                         Version: 1                                                             +--------------------------------------+                                                             :                                      :                                                             :                                      :                                                             +--------------------------------------+  [place]                                                                  Transthoracic Echocardiographic Report ______________________________________________________________________________ Name: Pruitt, [name]                            MRN: [id number]                                   Weight: 180.007 lb Study Date: 11/13/2021, 10: 44 AM                  DOB: 11-Aug-1950                                 Height: 65.98 in Gender: Female                                                                                     BSA: 1.91 m2 Patient Location: PRN NON INVASIVE CARD PRN  Referring Physician: Wilburn Mylar Ordering Physician: Vern Claude D Tech: Marcella Dubs ______________________________________________________________________________ Technical Quality:: The study images were of technically adequate quality. Quality: The study images were of technically adequate quality. Reason For Study: CVA (cerebral vascular accident) (CMS [facility]) Conclusions The study images were of technically adequate quality. Normal left ventricular size. Left ventricular systolic function is normal. The left ventricular ejection fraction by visual assessment is estimated to be 50-55%. Left ventricular diastolic parameters are normal. Normal right ventricular systolic function. There is mild tricuspid regurgitation. Findings: Procedure: Transthoracic complete echo with contrast, 2D, spectral and tissue Doppler, color flow Doppler, M-mode. Left Ventricle: Normal left ventricular size. Left ventricular systolic function is normal. The left ventricular ejection fraction by visual assessment is estimated to be 50-55%. Left ventricular diastolic parameters are normal. Right Ventricle: Normal right ventricular size. Normal right ventricular systolic function. Left Atrium: The left atrium is normal in size. Mitral valve: The mitral valve is normal. Tricuspid valve: The tricuspid valve is normal. There is mild tricuspid regurgitation. Aortic valve: The aortic valve is normal. Pulmonic valve: The pulmonic valve is normal. 2D/ M Mode                                                                        Doppler LVIDd: F: (3.8-5.2)/ M: (4.2-5.8)              AV Peak Vel: 122.2 cm/sec               (  100-170 LVIDs: F: (2.2-3.5/ M: 2.5-4.0)                                                       (70-90) IVSd: F: (0.6-0.9)/ M: (0.6-1.0               AV max PG: 6.0 mmHg                    (2.0-9.0) IVSs: 1.50 cm                                                                     AV Mean PG: 3.5 mmHg                    (2.0-4.0 LVPWd: F: (0.6-0.9)/ M: (0.6-1.0)              AVA Vmax): 2.14 cm2                                                                                     AVA VTI: 2.08 cm2                                                                                     AV DI (VTI): 0.73                                                                                     AV DI (vel): 0.75                                             F: (67-162)/ M: (29-476)                                             F: (43-95)/ M: (49-115)                 LVOT  diam: 1.90 cm                                                                                     LVOT Peak Vel: 91.7 cm/sec LA dimension: 3.6 cm                      F: (2.7-3.8)/ M: (3.0-4.0) LAV (MOD-bp): 30.4 ml                    F: (1.5-2.3)/ M: (1.5-2.3)              LVOT Peak PG: 3.4 mmHg                                             F: (8-24)/ M: (11-31)                   LVOT Mean PG: 1.91 mmHg                                             F: (1.5-2.3)/ M: (1.5-2.3)              LVOT VTI: 18.7 cm                                             F: (8-24)/ M: (11-31)                                             F: (15-27)/ M: (18-32)                  AV VTI: 25.6 cm RVd_basal: 1.88 cm                                             F: (8-20)/ M: (10-24)                                             F: (4.5-11)/ M: (5-12.6)                AV VR: 0.74                                             F: (1.6-6.4)/ M: (2.0-7.4)  42-56                                   SV(LVOT): 53.3 ml                                             F: (32-74)/ M: (36-87)                   SI(LVOT): 27.9 ml/m2                                             F: (8-36)/ M: (10-44) TAPSE: 3.1 cm                             20.5-27.5                                             F: (46-106)/ M: (62-150)                                             F: (14-42)/ M: (21-61)                  MV E  Peak Vel: 70.3 cm/sec                                                                                     MV A Peak Vel: 58.7 cm/sec EDV (MOD-bp): 50.9 ml                                                            MV Decel time: 0.25 sec ESV (MOD-bp): 18.4 ml EF (MOD-bp): 63.9 %                      F: (54-74)/ M: (52-72) EDV(MOD-sp4): 58.7 ml                                                            Lat Peak E' Vel: 7.7 cm/sec EDV(MOD-sp2): 39.5 ml  Lat E/E': 9.1 ESV(MOD-sp2): 15.6 ml                                                            Med Peak E' Vel: 8.1 cm/sec ESV(MOD-sp4): 20.5 ml                                                            Med E/E': 8.7                                                                                     MV V max: 69.4 cm/sec                                                                                     MV Peak Vel: 72.8 cm/sec                                             F:: (2.7-3.3)/ M: (3.1-3.7)            MVA PHT: 3.1 cm2                                             F: (2.3-2.9)/ M: (2.6-3.2)              MV VTI: 18.2 cm                                             F: (2.3-2.9)/ M: (2.6-3.2)              MVA (VTI): 2.9 cm2                                                                                     MV V max: 69.4 cm/sec  MV dec slope: 297.5 cm/sec2                                                                                     MR Peak Vel: 371.0 cm/sec                                                                                     MR Peak PG: 68.0 mmHg                                                                                     PV Peak Vel: 81.4 cm/sec                                                                                     TV S' Vel: 16.3 cm/sec                                                                                      TR Vmax: 217.2 cm/sec                                                                                     TR Peak PG: 18.9 mmHg  RAP systole: 6.0 mmHg                                                                                     RVSP: 24.9 mmHg                                                                                     TV Peak Vel: 41.6 cm/sec                                                                                     TV V mean: 22.8 cm/sec                                                                                     TV Peak PG: 0.69 mmHg                                                                                     TV Mean PG: 0.25 mmHg                                                                                     TV VTI: 13.2 cm ______________________________________________________________________________ Electronically signed by: Anderson Malta  11/13/2021, 11: 24 PM     MRI BRAIN WO CONTRAST    Result Date: 11/12/2021  Izora Gala L [place] RADIOLOGIST: Colletta Maryland, MD MRI BRAIN WO CONTRAST performed on 11/12/2021 6:21 PM CLINICAL HISTORY: Possible stroke, please assess for ischemic versus hemorrhage.  ACUTE MENTAL CONFUSION CONFUSION TECHNIQUE: Noncontrast brain MRI. COMPARISON:  None.  FINDINGS: Brain: There is mild ventricular and sulcal prominence with some mild generalized cerebral volume loss. There are multiple foci of increased signal intensity in the cerebral white matter on FLAIR and T2-weighted images likely due to chronic small vessel ischemic changes. No mass lesion, mass effect, or hemorrhage is identified in the brain. On diffusion-weighted images, there is a subtle oval-shaped focus of restricted diffusion in the right anterior right parietal white matter as with a small acute or subacute infarct. No associated hemorrhage. Ventricles: Consistent with the  overall degree of cerebral atrophy. Major Intracranial Vessels: Normal flow voids. Sinuses: Clear. Mastoids: Clear.     1.SMALL SUBTLE FOCUS OF RESTRICTED DIFFUSION IN THE RIGHT ANTERIOR PARIETAL WHITE MATTER CONSISTENT WITH AN ACUTE OR SUBACUTE INFARCT. 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 AMS TECHNIQUE: Frontal view of the chest. COMPARISON:  None FINDINGS: The heart size is normal. The lungs are clear. There is some mild dorsal spurring and slight right scoliosis     NO ACUTE FINDINGS. Radiologist location ID: [id number]     CAROTID ARTERY DUPLEX    Result Date: 11/13/2021  Izora Gala L [place]uh[place] RADIOLOGIST: Brayton El, MD EXAMINATION: CAROTID ARTERY DUPLEX EXAM DATE/TIME: 11/12/2021 8:34 PM CLINICAL INDICATION: G45.9: TIA (transient ischemic attack) COMPARISON: None. FINDINGS TECHNIQUE: Grayscale and color Doppler ultrasound examination of the carotid and vertebral artery systems bilaterally. Maximum peak systolic velocity (PSV) / end diastolic velocity (EDV) measurements were obtained. RIGHT CAROTID SYSTEM: Right Common Carotid Artery (RCCA): 105cm/s Right Internal Carotid Artery (RICA): 107 cm/s Right ICA to CCA ratio: 1.6 Right Vertebral Artery (RVA): Antegrade flow with normal waveform. LEFT CAROTID SYSTEM: Left Common Carotid Artery (LCCA): 111 cm/s Left Internal Carotid Artery (LICA): 93 cm/s Left ICA/CCA ratio: 0.6 Left Vertebral Artery (LVA): Antegrade flow with normal waveform. OTHER FINDINGS:     Right internal carotid artery: Normal (no stenosis). Left internal carotid artery: Normal (no stenosis). Vertebral arteries: Antegrade flow with normal waveforms bilaterally. Radiologist location ID: [id number]     ECG 12  LEAD    Result Date: 11/13/2021  Normal sinus rhythm Low voltage QRS Borderline ECG No previous ECGs available Confirmed by Anderson Malta ([id number]) on 11/13/2021 9:47:16 AM      ______________________________________________________________________  Discharge Day Services:  Pt seen on the day of discharge and determined appropriate for discharge.    Condition at Discharge: good      DISCHARGE INSTRUCTIONS:    Post-Discharge Follow Up Appointments     Follow up with Peri Maris, DO in 1 week(s)    Phone: [phone]    Where: [place], [place] [address]           DISCHARGE INSTRUCTION - DIET     Diet: RESUME HOME DIET      DISCHARGE INSTRUCTION - ACTIVITY     Activity: AS TOLERATED      DISCHARGE INSTRUCTION - MISC    Follow Up with your PCP in 1 week.                Length of Discharge: I spent greater than 30 mins in the discharge of this patient.      Virl Cagey, MD   November 14, 2021

## 2021-11-15 ENCOUNTER — Telehealth (HOSPITAL_COMMUNITY): Payer: Self-pay

## 2022-04-12 ENCOUNTER — Emergency Department (EMERGENCY_DEPARTMENT_HOSPITAL): Payer: Commercial Managed Care - PPO

## 2022-04-12 ENCOUNTER — Encounter (HOSPITAL_BASED_OUTPATIENT_CLINIC_OR_DEPARTMENT_OTHER): Payer: Self-pay

## 2022-04-12 ENCOUNTER — Emergency Department
Admission: EM | Admit: 2022-04-12 | Discharge: 2022-04-12 | Disposition: A | Discharge status: Home / Self Care | Payer: Commercial Managed Care - PPO | Attending: Emergency Medicine | Admitting: Emergency Medicine

## 2022-04-12 ENCOUNTER — Other Ambulatory Visit: Payer: Self-pay

## 2022-04-12 DIAGNOSIS — M25569 Pain in unspecified knee: Secondary | ICD-10-CM

## 2022-04-12 DIAGNOSIS — S86812A Strain of other muscle(s) and tendon(s) at lower leg level, left leg, initial encounter: Secondary | ICD-10-CM

## 2022-04-12 DIAGNOSIS — S76112A Strain of left quadriceps muscle, fascia and tendon, initial encounter: Secondary | ICD-10-CM | POA: Insufficient documentation

## 2022-04-12 DIAGNOSIS — X58XXXA Exposure to other specified factors, initial encounter: Secondary | ICD-10-CM | POA: Insufficient documentation

## 2022-04-12 DIAGNOSIS — X509XXA Other and unspecified overexertion or strenuous movements or postures, initial encounter: Secondary | ICD-10-CM

## 2022-04-12 DIAGNOSIS — S86912A Strain of unspecified muscle(s) and tendon(s) at lower leg level, left leg, initial encounter: Secondary | ICD-10-CM

## 2022-04-12 DIAGNOSIS — M25561 Pain in right knee: Secondary | ICD-10-CM

## 2022-04-12 MED ORDER — KETOROLAC 30 MG/ML (1 ML) INJECTION SOLUTION
INTRAMUSCULAR | Status: AC
Start: 2022-04-12 — End: 2022-04-12
  Filled 2022-04-12: qty 1

## 2022-04-12 MED ORDER — KETOROLAC 10 MG TABLET
10.0000 mg | ORAL_TABLET | Freq: Four times a day (QID) | ORAL | 0 refills | Status: AC | PRN
Start: 2022-04-12 — End: ?

## 2022-04-12 MED ORDER — METHYLPREDNISOLONE ACETATE 40 MG/ML SUSPENSION FOR INJECTION
80.0000 mg | Freq: Once | INTRAMUSCULAR | Status: AC
Start: 2022-04-12 — End: 2022-04-12
  Administered 2022-04-12: 80 mg via INTRAMUSCULAR

## 2022-04-12 MED ORDER — METHYLPREDNISOLONE ACETATE 40 MG/ML SUSPENSION FOR INJECTION
INTRAMUSCULAR | Status: AC
Start: 2022-04-12 — End: 2022-04-12
  Filled 2022-04-12: qty 2

## 2022-04-12 MED ORDER — KETOROLAC 30 MG/ML (1 ML) INJECTION SOLUTION
30.0000 mg | INTRAMUSCULAR | Status: AC
Start: 2022-04-12 — End: 2022-04-12
  Administered 2022-04-12: 30 mg via INTRAMUSCULAR

## 2022-04-12 NOTE — ED Triage Notes (Signed)
Patient states happened yesterday.  States she was walking up the steps and her left leg just stopped.  Now has pain in left leg.  Also states right leg hurt a little.

## 2022-04-12 NOTE — ED Nurses Note (Signed)
Reviewed test results with patient

## 2022-04-12 NOTE — ED Nurses Note (Signed)
Patient discharged home all instructions gone over with patient including medications with opportunity to ask questions. Ace wrap to left leg. Patient verbalized understanding and ambulated off unit independently.

## 2022-04-12 NOTE — ED Provider Notes (Signed)
[facility], [facility] Emergency Department  ED Primary Provider Note  History of Present Illness   Chief Complaint   Patient presents with    Leg Injury     Kimberly Pruitt is a 71 y.o. female who had concerns including Leg Injury.  Arrival: The patient arrived by Car complaining of left knee pain yesterday after walking up set a steps.  She felt something pull in the back of her knee.  She now has soreness in her entire knee as well as the proximal calf.  Patient denies any fever chills.  No numbness or tingling in the extremity.  Patient denies any lower back pain.  No hip pain.    HPI  Review of Systems   Review of Systems   Constitutional:  Positive for activity change. Negative for chills and fever.   HENT:  Negative for ear pain and sore throat.    Eyes:  Negative for pain and visual disturbance.   Respiratory:  Negative for cough and shortness of breath.    Cardiovascular:  Negative for chest pain and palpitations.   Gastrointestinal:  Negative for abdominal pain and vomiting.   Genitourinary:  Negative for dysuria and hematuria.   Musculoskeletal:  Positive for arthralgias, gait problem, joint swelling and myalgias. Negative for back pain.   Skin:  Negative for color change and rash.   Neurological:  Negative for seizures and syncope.   All other systems reviewed and are negative.     Historical Data   History Reviewed This Encounter:     Physical Exam   ED Triage Vitals [04/12/22 1148]   BP (Non-Invasive) 135/84   Heart Rate 72   Respiratory Rate 18   Temperature 36.7 C (98.1 F)   SpO2 98 %   Weight 77.1 kg (170 lb)   Height 1.702 m (5\' 7" )     Physical Exam  Vitals and nursing note reviewed.   Constitutional:       General: She is not in acute distress.     Appearance: She is well-developed. She is obese.   HENT:      Head: Normocephalic and atraumatic.      Right Ear: External ear normal.      Left Ear: External ear normal.      Nose: Nose normal.      Mouth/Throat:       Mouth: Mucous membranes are moist.   Eyes:      Extraocular Movements: Extraocular movements intact.      Conjunctiva/sclera: Conjunctivae normal.      Pupils: Pupils are equal, round, and reactive to light.   Cardiovascular:      Rate and Rhythm: Normal rate and regular rhythm.      Pulses: Normal pulses.      Heart sounds: Normal heart sounds. No murmur heard.  Pulmonary:      Effort: Pulmonary effort is normal. No respiratory distress.      Breath sounds: Normal breath sounds.   Abdominal:      General: Bowel sounds are normal.      Palpations: Abdomen is soft.      Tenderness: There is no abdominal tenderness.   Musculoskeletal:         General: Swelling and tenderness present.      Cervical back: Normal range of motion and neck supple.      Comments: Mild swelling over the anterior portion of the knee.  Positive tenderness over the posterior aspect of the  knee at the calf insertion site.  No crepitus or deformity.   Skin:     General: Skin is warm and dry.      Capillary Refill: Capillary refill takes less than 2 seconds.   Neurological:      General: No focal deficit present.      Mental Status: She is alert and oriented to person, place, and time.   Psychiatric:         Mood and Affect: Mood normal.         Thought Content: Thought content normal.         Judgment: Judgment normal.       Patient Data   Labs Ordered/Reviewed - No data to display  XR KNEE LEFT 4 OR MORE VIEWS   Final Result by Edi, Radresults In (10/31 1216)   No acute fracture of the left knee is identified.            Radiologist location ID: [place] Decision Making        Medical Decision Making  Patient is 71 year old white female complaining walking up steps yesterday and felt sharp pain in the back of her knee.  Patient then states it hurt for several hours and she could not weight bear on the leg.  She said after that it went away and she is had no pain since.  Patient denies any swelling to the lower leg.  No calf  tenderness.  Patient states that the posterior aspect of her knee hurts.  Patient denies any DVT history or PE.  Patient denies any lower back pain radiating down the hip.  Patient will have an x-ray of her knee and then be given analgesia an anti-inflammatory agent and discharged home.  She will then follow up with her family physician in the next several days.    Amount and/or Complexity of Data Reviewed  Radiology: ordered.    Risk  Prescription drug management.             Medications Administered in the ED   ketorolac (TORADOL) 30 mg/mL injection (has no administration in time range)   methylPREDNISolone acetate (DEPO-medrol) 40 mg/mL injection (has no administration in time range)     Clinical Impression   Knee strain, left, initial encounter (Primary)   Strain of calf muscle, left, initial encounter       Disposition: Discharged               Clinical Impression   Knee strain, left, initial encounter (Primary)   Strain of calf muscle, left, initial encounter       Current Discharge Medication List        START taking these medications    Details   ketorolac tromethamine (TORADOL) 10 mg Oral Tablet Take 1 Tablet (10 mg total) by mouth Every 6 hours as needed for Pain  Qty: 20 Tablet, Refills: 0

## 2023-03-15 ENCOUNTER — Other Ambulatory Visit (HOSPITAL_COMMUNITY): Payer: Self-pay | Admitting: Family Medicine

## 2023-03-15 DIAGNOSIS — R413 Other amnesia: Secondary | ICD-10-CM

## 2023-03-15 DIAGNOSIS — Z1231 Encounter for screening mammogram for malignant neoplasm of breast: Secondary | ICD-10-CM

## 2023-03-15 DIAGNOSIS — Z780 Asymptomatic menopausal state: Secondary | ICD-10-CM

## 2023-03-24 IMAGING — MR MRI BRAIN W/O CONTRAST
9 series · 48 of 48 positions shown · IV contrast (gadolinium)
Comparison: MRI brain dated 08/31/2021.

﻿EXAM:  MRI BRAIN W/O CONTRAST
INDICATION: 72-year-old with altered mental status, confusion and headaches.  Trauma due to fall a year ago.
TECHNIQUE: Multiplanar, multisequential MRI of the brain was performed without gadolinium contrast.

[Series 9: DWI · axial · 5.0mm · 1.35mm/px · z∈[-37,+89]mm · 15 of 87 slices shown (1 of 3)]
[im 1/87]
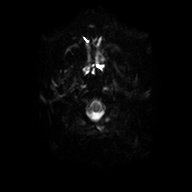
[im 7/87]
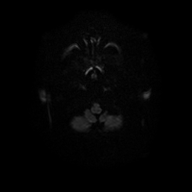
[im 13/87]
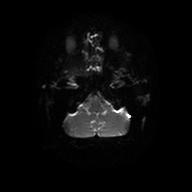
[im 19/87]
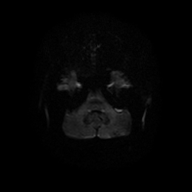
[im 25/87]
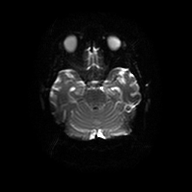
[im 31/87]
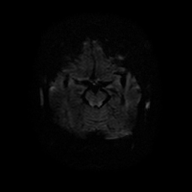
[im 37/87]
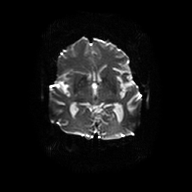
[im 44/87]
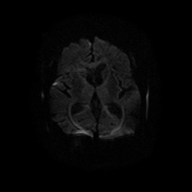
[im 50/87]
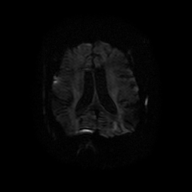
[im 56/87]
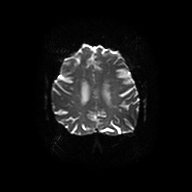
[im 62/87]
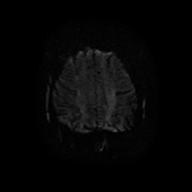
[im 68/87]
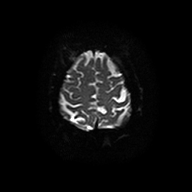
[im 74/87]
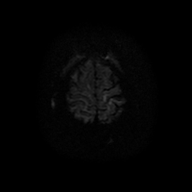
[im 80/87]
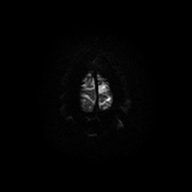
[im 87/87]
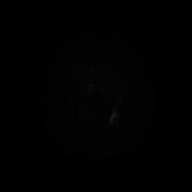

[Series 10: DWI · axial · 5.0mm · 1.35mm/px · z∈[-37,+89]mm · 4 of 22 slices shown (2 of 3)]
[im 1/22]
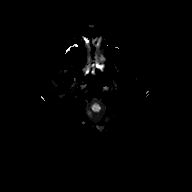
[im 8/22]
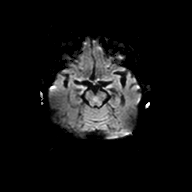
[im 15/22]
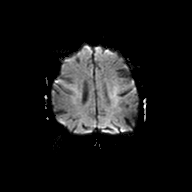
[im 22/22]
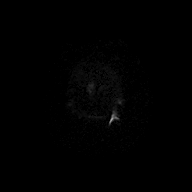

[Series 11: DWI · axial · 5.0mm · 1.35mm/px · z∈[-37,+89]mm · 4 of 22 slices shown (3 of 3)]
[im 1/22]
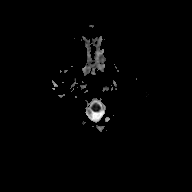
[im 8/22]
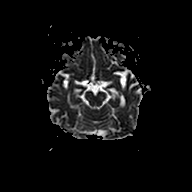
[im 15/22]
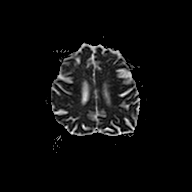
[im 22/22]
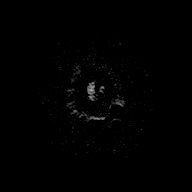

[Series 12: FLAIR · sagittal · 4.0mm · 0.75mm/px · 5 of 26 slices shown (1 of 2)]
[im 1/26]
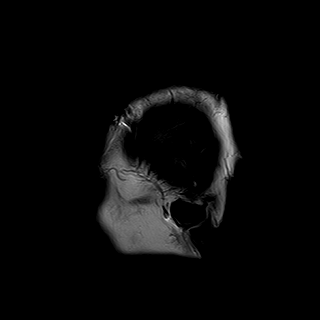
[im 7/26]
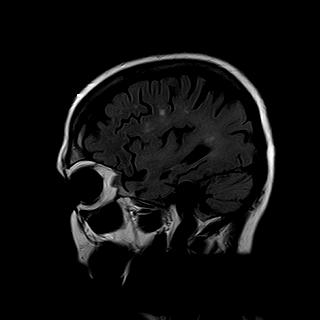
[im 13/26]
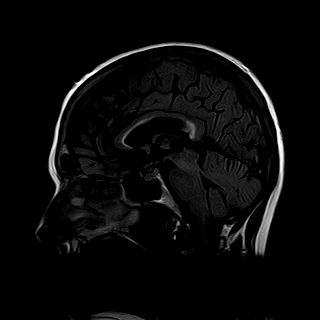
[im 19/26]
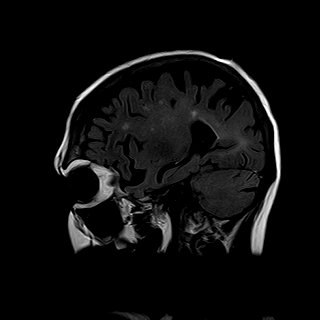
[im 26/26]
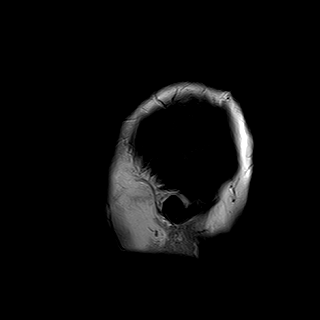

[Series 13: T2 · axial · 5.0mm · 0.57mm/px · z∈[-41,+85]mm · 4 of 22 slices shown (1 of 2)]
[im 1/22]
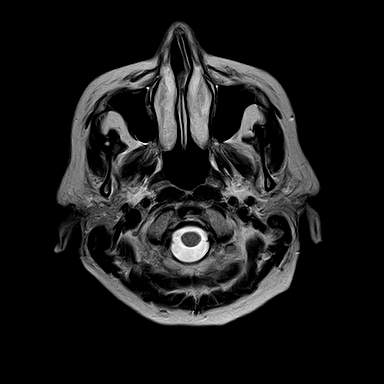
[im 8/22]
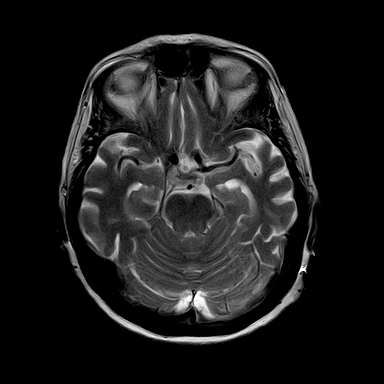
[im 15/22]
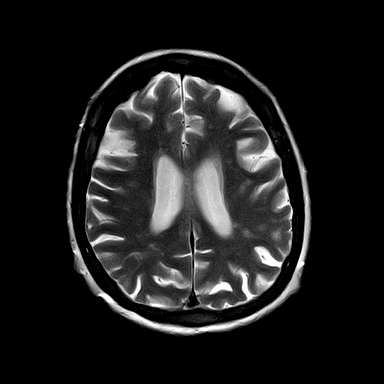
[im 22/22]
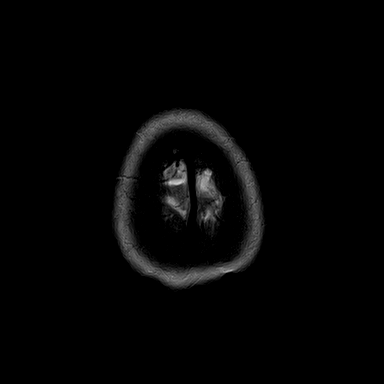

[Series 14: FLAIR · axial · 5.0mm · 0.76mm/px · z∈[-41,+85]mm · 4 of 22 slices shown (2 of 2)]
[im 1/22]
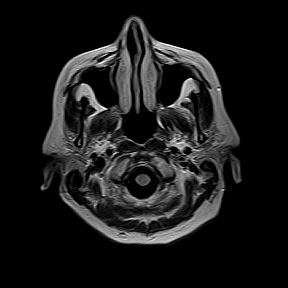
[im 8/22]
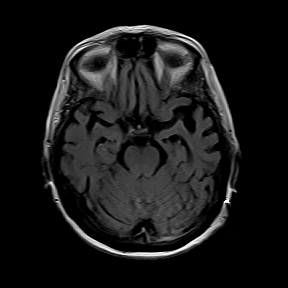
[im 15/22]
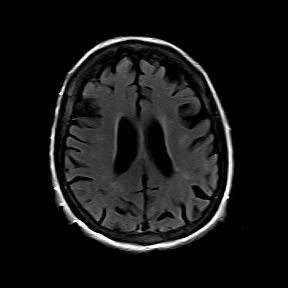
[im 22/22]
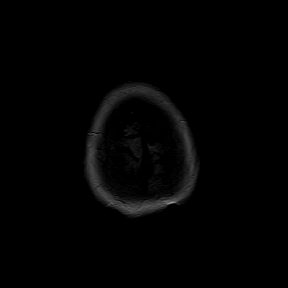

[Series 15: T1 · axial · 5.0mm · 0.69mm/px · z∈[-41,+85]mm · 4 of 22 slices shown]
[im 1/22]
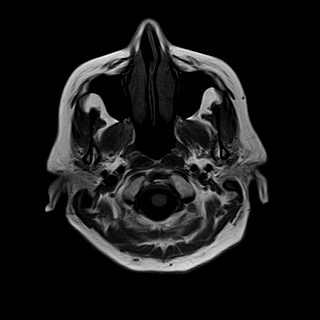
[im 8/22]
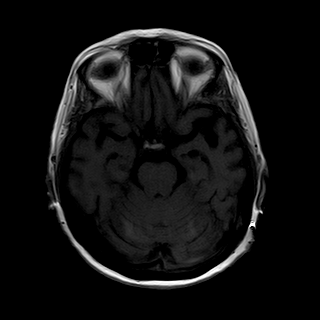
[im 15/22]
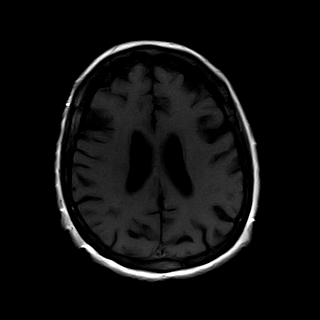
[im 22/22]
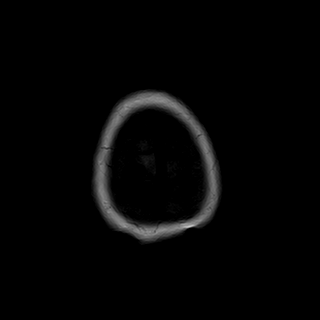

[Series 16: T2-star · axial · 5.0mm · 0.69mm/px · z∈[-41,+85]mm · 4 of 22 slices shown]
[im 1/22]
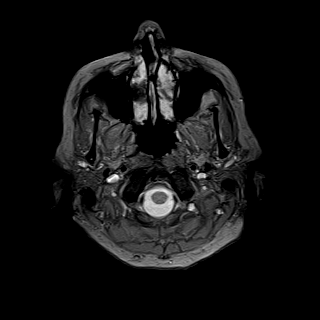
[im 8/22]
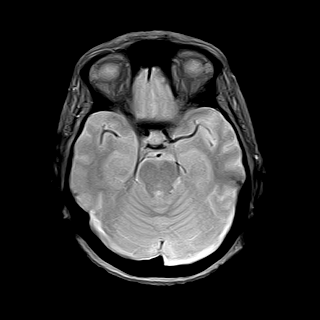
[im 15/22]
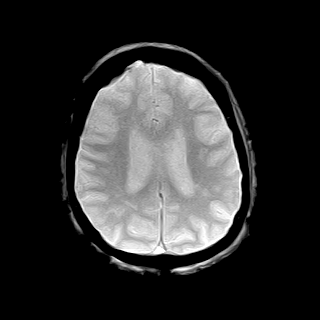
[im 22/22]
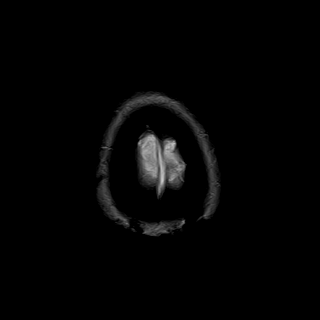

[Series 17: T2 · coronal · 6.0mm · 0.43mm/px · 4 of 24 slices shown (2 of 2)]
[im 1/24]
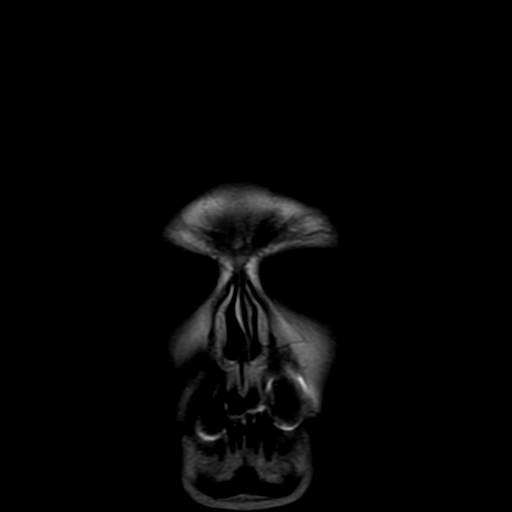
[im 8/24]
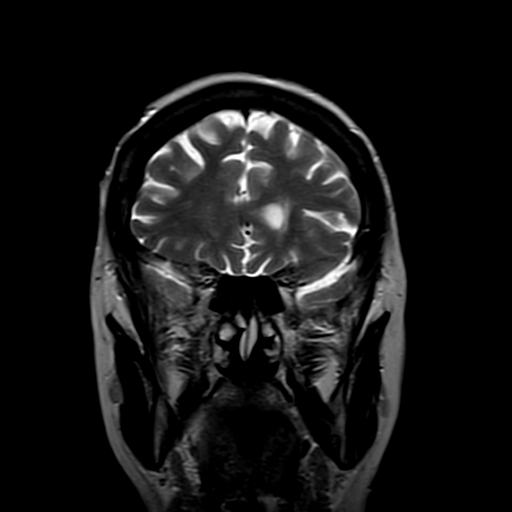
[im 16/24]
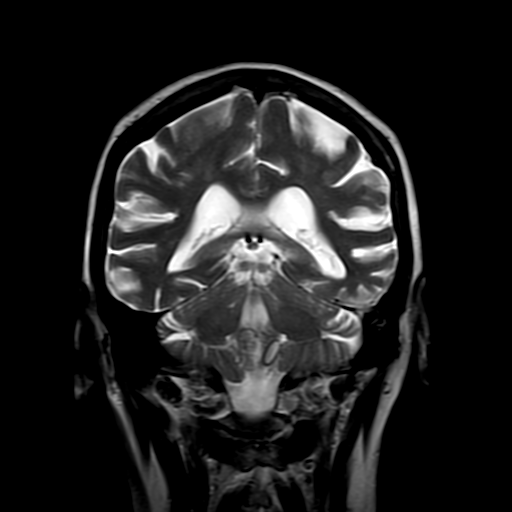
[im 24/24]
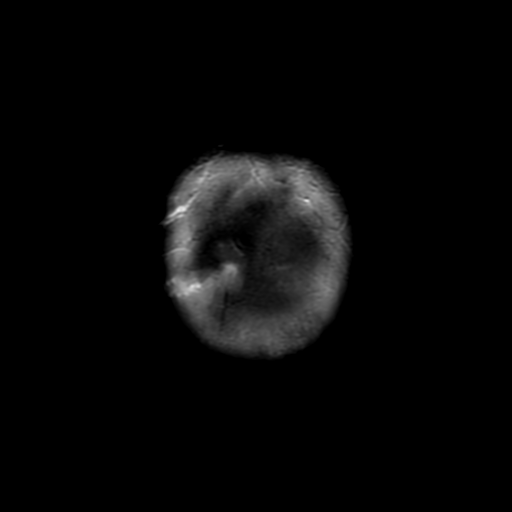

[48 of 48 positions shown; findings below may reference images not displayed]

FINDINGS: No acute ischemic change on the diffusion sequence.  No evidence of intracranial bleed or extra-axial collections are seen.  Age-appropriate, symmetric cerebral cortical atrophy.  No ventriculomegaly or midline shift. Significant chronic small-vessel ischemic changes of periventricular white matter are stable.

Major arteries of circle of Willis and dural venous sinuses are patent.
IMPRESSION: 1. No acute ischemia.  No intracranial bleed or space-occupying lesions.  

2. Age-appropriate cerebral cortical atrophy.  No ventriculomegaly.  Chronic small-vessel ischemic change of periventricular white matter.

3. Compared with previous examination no significant changes are noted.

Electronically Signed by MAZ, SAVITHA at [id number] [DATE]

## 2023-04-27 ENCOUNTER — Encounter (HOSPITAL_BASED_OUTPATIENT_CLINIC_OR_DEPARTMENT_OTHER): Payer: Self-pay

## 2023-04-27 ENCOUNTER — Emergency Department
Admission: EM | Admit: 2023-04-27 | Discharge: 2023-04-27 | Disposition: A | Discharge status: Home / Self Care | Payer: Commercial Managed Care - PPO | Attending: Family | Admitting: Family

## 2023-04-27 ENCOUNTER — Emergency Department (HOSPITAL_BASED_OUTPATIENT_CLINIC_OR_DEPARTMENT_OTHER): Payer: Commercial Managed Care - PPO

## 2023-04-27 ENCOUNTER — Other Ambulatory Visit: Payer: Self-pay

## 2023-04-27 DIAGNOSIS — M545 Low back pain, unspecified: Secondary | ICD-10-CM

## 2023-04-27 DIAGNOSIS — N390 Urinary tract infection, site not specified: Secondary | ICD-10-CM | POA: Insufficient documentation

## 2023-04-27 DIAGNOSIS — M51369 Other intervertebral disc degeneration, lumbar region without mention of lumbar back pain or lower extremity pain: Secondary | ICD-10-CM | POA: Insufficient documentation

## 2023-04-27 DIAGNOSIS — R0789 Other chest pain: Secondary | ICD-10-CM | POA: Insufficient documentation

## 2023-04-27 DIAGNOSIS — K219 Gastro-esophageal reflux disease without esophagitis: Secondary | ICD-10-CM | POA: Insufficient documentation

## 2023-04-27 DIAGNOSIS — R001 Bradycardia, unspecified: Secondary | ICD-10-CM | POA: Insufficient documentation

## 2023-04-27 LAB — COMPREHENSIVE METABOLIC PANEL, NON-FASTING
ALBUMIN/GLOBULIN RATIO: 0.9 (ref 0.8–1.4)
ALBUMIN: 3.5 g/dL (ref 3.4–5.0)
ALKALINE PHOSPHATASE: 194 U/L — ABNORMAL HIGH (ref 46–116)
ALT (SGPT): 23 U/L (ref <=78)
ANION GAP: 8 mmol/L (ref 4–13)
AST (SGOT): 21 U/L (ref 15–37)
BILIRUBIN TOTAL: 0.4 mg/dL (ref 0.2–1.0)
BUN/CREA RATIO: 16
BUN: 15 mg/dL (ref 7–18)
CALCIUM, CORRECTED: 9.1 mg/dL
CALCIUM: 8.7 mg/dL (ref 8.5–10.1)
CHLORIDE: 104 mmol/L (ref 98–107)
CO2 TOTAL: 29 mmol/L (ref 21–32)
CREATININE: 0.95 mg/dL (ref 0.55–1.02)
ESTIMATED GFR: 64 mL/min/{1.73_m2} (ref >59)
GLOBULIN: 3.9
GLUCOSE: 109 mg/dL — ABNORMAL HIGH (ref 74–106)
OSMOLALITY, CALCULATED: 283 mosm/kg (ref 270–290)
POTASSIUM: 3.7 mmol/L (ref 3.5–5.1)
PROTEIN TOTAL: 7.4 g/dL (ref 6.4–8.2)
SODIUM: 141 mmol/L (ref 136–145)

## 2023-04-27 LAB — URINALYSIS, MICROSCOPIC

## 2023-04-27 LAB — CBC WITH DIFF
BASOPHIL #: 0.02 10*3/uL (ref 0.00–0.30)
BASOPHIL %: 0 % (ref 0–3)
EOSINOPHIL #: 0.07 10*3/uL (ref 0.00–0.80)
EOSINOPHIL %: 1 % (ref 1–7)
HCT: 43.2 % (ref 37.0–47.0)
HGB: 14.1 g/dL (ref 12.5–16.0)
LYMPHOCYTE #: 2.98 10*3/uL (ref 1.10–5.00)
LYMPHOCYTE %: 32 % (ref 25–45)
MCH: 29.7 pg (ref 27.0–32.0)
MCHC: 32.5 g/dL (ref 32.0–36.0)
MCV: 91.3 fL (ref 78.0–99.0)
MONOCYTE #: 0.64 10*3/uL (ref 0.00–1.30)
MONOCYTE %: 7 % (ref 0–12)
MPV: 8.2 fL (ref 7.4–10.4)
NEUTROPHIL #: 5.64 10*3/uL (ref 1.80–8.40)
NEUTROPHIL %: 60 % (ref 40–76)
PLATELETS: 197 10*3/uL (ref 140–440)
RBC: 4.73 10*6/uL (ref 4.20–5.40)
RDW: 15.2 % — ABNORMAL HIGH (ref 11.6–14.8)
WBC: 9.4 10*3/uL (ref 4.0–10.5)

## 2023-04-27 LAB — URINALYSIS, MACRO/MICRO
BILIRUBIN: NEGATIVE mg/dL
GLUCOSE: NEGATIVE mg/dL
KETONES: NEGATIVE mg/dL
NITRITE: NEGATIVE
PH: 6 (ref 4.6–8.0)
PROTEIN: NEGATIVE mg/dL
SPECIFIC GRAVITY: 1.01 (ref 1.003–1.035)
UROBILINOGEN: 0.2 mg/dL (ref 0.2–1.0)

## 2023-04-27 LAB — TROPONIN-I: TROPONIN I: 5 ng/L (ref <15)

## 2023-04-27 MED ORDER — KETOROLAC 30 MG/ML (1 ML) INJECTION SOLUTION
INTRAMUSCULAR | Status: AC
Start: 2023-04-27 — End: 2023-04-27
  Filled 2023-04-27: qty 1

## 2023-04-27 MED ORDER — PANTOPRAZOLE 40 MG INTRAVENOUS SOLUTION
INTRAVENOUS | Status: AC
Start: 2023-04-27 — End: 2023-04-27
  Filled 2023-04-27: qty 10

## 2023-04-27 MED ORDER — ONDANSETRON HCL (PF) 4 MG/2 ML INJECTION SOLUTION
4.0000 mg | INTRAMUSCULAR | Status: AC
Start: 2023-04-27 — End: 2023-04-27
  Administered 2023-04-27: 4 mg via INTRAVENOUS

## 2023-04-27 MED ORDER — SODIUM CHLORIDE 0.9 % INTRAVENOUS PIGGYBACK
1.0000 g | INTRAVENOUS | Status: AC
Start: 2023-04-27 — End: 2023-04-27
  Administered 2023-04-27: 0 g via INTRAVENOUS
  Administered 2023-04-27: 1 g via INTRAVENOUS

## 2023-04-27 MED ORDER — BACLOFEN 5 MG TABLET
5.0000 mg | ORAL_TABLET | Freq: Two times a day (BID) | ORAL | 0 refills | Status: DC
Start: 2023-04-27 — End: 2023-04-29

## 2023-04-27 MED ORDER — SODIUM CHLORIDE 0.9 % (FLUSH) INJECTION SYRINGE
3.0000 mL | INJECTION | INTRAMUSCULAR | Status: DC | PRN
Start: 2023-04-27 — End: 2023-04-28

## 2023-04-27 MED ORDER — ALUMINUM-MAG HYDROXIDE-SIMETHICONE 200 MG-200 MG-20 MG/5 ML ORAL SUSP
ORAL | Status: AC
Start: 2023-04-27 — End: 2023-04-27
  Filled 2023-04-27: qty 30

## 2023-04-27 MED ORDER — LIDOCAINE HCL 2 % MUCOSAL SOLUTION
Status: AC
Start: 2023-04-27 — End: 2023-04-27
  Filled 2023-04-27: qty 15

## 2023-04-27 MED ORDER — CEFTRIAXONE 1 GRAM SOLUTION FOR INJECTION
INTRAMUSCULAR | Status: AC
Start: 2023-04-27 — End: 2023-04-27
  Filled 2023-04-27: qty 10

## 2023-04-27 MED ORDER — PANTOPRAZOLE 40 MG INTRAVENOUS SOLUTION
40.0000 mg | INTRAVENOUS | Status: AC
Start: 2023-04-27 — End: 2023-04-27
  Administered 2023-04-27: 40 mg via INTRAVENOUS

## 2023-04-27 MED ORDER — TRAMADOL 37.5 MG-ACETAMINOPHEN 325 MG TABLET
1.0000 | ORAL_TABLET | Freq: Four times a day (QID) | ORAL | 0 refills | Status: AC | PRN
Start: 2023-04-27 — End: ?

## 2023-04-27 MED ORDER — PANTOPRAZOLE 40 MG TABLET,DELAYED RELEASE
40.0000 mg | DELAYED_RELEASE_TABLET | Freq: Every day | ORAL | 0 refills | Status: AC
Start: 2023-04-27 — End: 2023-05-27

## 2023-04-27 MED ORDER — HYOSCYAMINE 0.125 MG/5 ML ORAL ELIXIR
ORAL_SOLUTION | ORAL | Status: AC
Start: 2023-04-27 — End: 2023-04-27
  Filled 2023-04-27: qty 5

## 2023-04-27 MED ORDER — NITROFURANTOIN MONOHYDRATE/MACROCRYSTALS 100 MG CAPSULE
100.0000 mg | ORAL_CAPSULE | Freq: Two times a day (BID) | ORAL | 0 refills | Status: DC
Start: 2023-04-27 — End: 2023-05-04

## 2023-04-27 MED ORDER — TRAMADOL 50 MG TABLET
ORAL_TABLET | ORAL | Status: AC
Start: 2023-04-27 — End: 2023-04-27
  Filled 2023-04-27: qty 2

## 2023-04-27 MED ORDER — TRAMADOL 50 MG TABLET
100.0000 mg | ORAL_TABLET | ORAL | Status: AC
Start: 2023-04-27 — End: 2023-04-27
  Administered 2023-04-27: 100 mg via ORAL

## 2023-04-27 MED ORDER — SODIUM CHLORIDE 0.9 % INTRAVENOUS PIGGYBACK
INJECTION | INTRAVENOUS | Status: AC
Start: 2023-04-27 — End: 2023-04-27
  Filled 2023-04-27: qty 50

## 2023-04-27 MED ORDER — HYOSCYAMINE 0.125 MG/5 ML ORAL ELIXIR
10.0000 mL | ORAL_SOLUTION | Freq: Once | ORAL | Status: AC
Start: 2023-04-27 — End: 2023-04-27
  Administered 2023-04-27: 10 mL via ORAL

## 2023-04-27 MED ORDER — ONDANSETRON HCL (PF) 4 MG/2 ML INJECTION SOLUTION
INTRAMUSCULAR | Status: AC
Start: 2023-04-27 — End: 2023-04-27
  Filled 2023-04-27: qty 2

## 2023-04-27 MED ORDER — KETOROLAC 30 MG/ML (1 ML) INJECTION SOLUTION
15.0000 mg | INTRAMUSCULAR | Status: AC
Start: 2023-04-27 — End: 2023-04-27
  Administered 2023-04-27: 15 mg via INTRAVENOUS

## 2023-04-27 MED ORDER — LIDOCAINE HCL 2 % MUCOSAL SOLUTION
15.0000 mL | Freq: Once | Status: AC
Start: 2023-04-27 — End: 2023-04-27
  Administered 2023-04-27: 15 mL via ORAL

## 2023-04-27 MED ORDER — ALUMINUM-MAG HYDROXIDE-SIMETHICONE 200 MG-200 MG-20 MG/5 ML ORAL SUSP
30.0000 mL | Freq: Once | ORAL | Status: AC
Start: 2023-04-27 — End: 2023-04-27
  Administered 2023-04-27: 30 mL via ORAL

## 2023-04-27 MED ORDER — SODIUM CHLORIDE 0.9 % (FLUSH) INJECTION SYRINGE
3.0000 mL | INJECTION | Freq: Three times a day (TID) | INTRAMUSCULAR | Status: DC
Start: 2023-04-27 — End: 2023-04-28

## 2023-04-27 NOTE — ED Triage Notes (Signed)
Patient states that abdominal pain and back pain began this morning, and it has progressively gotten worse throughout the day. Patient states pain is intermittent, currently pain is 1/10. Describes it has sharp and shooting  pain.

## 2023-04-27 NOTE — ED Provider Notes (Signed)
[facility] - Emergency Department  ED Primary Provider Note  History of Present Illness   Chief Complaint   Patient presents with    Abdominal Pain    Flank Pain    Back Pain     Arrival: The patient arrived by Car  Kimberly Pruitt is a 72 y.o. female who had concerns including Abdominal Pain, Flank Pain, and Back Pain.  Pt states she fell  a few days ago didn't think she got hurt. States today was sitting on the toilet and had a sharp pain in rt kidney and certain movements hurt.   Review of Systems   Constitutional: No fever, chills or weakness   Skin: No rash or diaphoresis  HENT: No headaches, or congestion  Eyes: No vision changes or photophobia   Cardio: No chest pain, palpitations or leg swelling   Respiratory: No cough, wheezing or SOB  GI:  No nausea, vomiting or stool changes  GU:  No dysuria, hematuria, or increased frequency+ flank pain   MSK: No muscle aches, joint +back pain  Neuro: No seizures, LOC, numbness, tingling, or focal weakness  Psychiatric: No depression, SI or substance abuse  All other systems reviewed and are negative.    History Reviewed This Encounter: all noted and reviewed.     Physical Exam   ED Triage Vitals [04/27/23 1931]   BP (Non-Invasive) (!) 176/93   Heart Rate 74   Respiratory Rate 18   Temperature 36.7 C (98.1 F)   SpO2 98 %   Weight 72.6 kg (160 lb)   Height 1.702 m (5\' 7" )       Constitutional:  73 y.o. female who appears in no distress. Normal color, no cyanosis.   HENT:   Head: Normocephalic and atraumatic.   Mouth/Throat: Oropharynx is clear and moist.   Eyes: EOMI, PERRL   Neck: Trachea midline. Neck supple.  Cardiovascular: RRR, No murmurs, rubs or gallops. Intact distal pulses.  Pulmonary/Chest: BS equal bilaterally. No respiratory distress. No wheezes, rales or chest tenderness.   Abdominal: Bowel sounds present and normal. Abdomen soft, no tenderness, no rebound and no guarding.  Back: + lower LS  midline spinal tenderness, no  paraspinal tenderness, + rt paraspinal + rt  CVA tenderness.           Musculoskeletal: No edema, tenderness or deformity.  Skin: warm and dry. No rash, erythema, pallor or cyanosis  Psychiatric: normal mood and affect. Behavior is normal.   Neurological: Patient keenly alert and responsive, easily able to raise eyebrows, facial muscles/expressions symmetric, speaking in fluent sentences, moving all extremities equally and fully, normal gait  Patient Data     Labs Ordered/Reviewed   COMPREHENSIVE METABOLIC PANEL, NON-FASTING - Abnormal; Notable for the following components:       Result Value    GLUCOSE 109 (*)     ALKALINE PHOSPHATASE 194 (*)     All other components within normal limits    Narrative:     Estimated Glomerular Filtration Rate (eGFR) is calculated using the CKD-EPI (2021) equation, intended for patients 42 years of age and older. If gender is not documented or "unknown", there will be no eGFR calculation.   CBC WITH DIFF - Abnormal; Notable for the following components:    RDW 15.2 (*)     All other components within normal limits   URINALYSIS, MACRO/MICRO - Abnormal; Notable for the following components:    LEUKOCYTES Large (*)     BLOOD Small (*)  All other components within normal limits   URINALYSIS, MICROSCOPIC - Abnormal; Notable for the following components:    RBCS 3-5 (*)     BACTERIA Occasional (*)     WBCS 11-15 (*)     All other components within normal limits   TROPONIN-I - Normal    Narrative:     Values received on females ranging between 12-15 ng/L MUST include the next serial troponin to review changes in the delta differences as the reference range for the Access II chemistry analyzer is lower than the established reference range.     URINE CULTURE,ROUTINE   CBC/DIFF    Narrative:     The following orders were created for panel order CBC/DIFF.  Procedure                               Abnormality         Status                     ---------                                -----------         ------                     CBC WITH ACZY[[id number]]                Abnormal            Final result                 Please view results for these tests on the individual orders.   URINALYSIS WITH REFLEX MICROSCOPIC AND CULTURE IF POSITIVE    Narrative:     The following orders were created for panel order URINALYSIS WITH REFLEX MICROSCOPIC AND CULTURE IF POSITIVE.  Procedure                               Abnormality         Status                     ---------                               -----------         ------                     URINALYSIS, MACRO/MICRO[[id number]]      Abnormal            Final result               URINALYSIS, MICROSCOPIC[[id number]]      Abnormal            Final result                 Please view results for these tests on the individual orders.     CT ABDOMEN PELVIS WO IV CONTRAST   Final Result by Edi, Radresults In (11/14 2037)   NO ACUTE FINDINGS AT THE ABDOMEN OR PELVIS ON NONCONTRAST CT.          One or more dose reduction techniques were used (e.g., Automated exposure control, adjustment  of the mA and/or kV according to patient size, use of iterative reconstruction technique).         Radiologist location ID: [id number]           Medical Decision Making   Diff dx of UTI stone muscle spasm ddd. Ct neg for stone or fx pt has ddd changes. Pt has UTI. Wbc 8.  Approx 45 min after DC pt co  mid epigastric pain.  She was given a gi cock tail with relief. She had an EKG sinus brady no st changes. Neg trop. Pt had immed relief with gi cock tail. She felt like the cp caused the epigastric pain. Denies sob nor cp. Pointed to epigastric area . Pain free at DC skin pink warm dry.   ED Course as of 04/27/23 2318   Thu Apr 27, 2023   2110 CT ABDOMEN PELVIS WO IV CONTRAST            Medications Administered in the ED   NS flush syringe (has no administration in time range)   NS flush syringe (has no administration in time range)   ketorolac (TORADOL) 30 mg/mL injection (15 mg  Intravenous Given 04/27/23 2035)   traMADol (ULTRAM) tablet (100 mg Oral Given 04/27/23 2035)   ondansetron (ZOFRAN) 2 mg/mL injection (4 mg Intravenous Given 04/27/23 2035)   cefTRIAXone (ROCEPHIN) 1 g in NS 50 mL IVPB minibag (0 g Intravenous Stopped 04/27/23 2205)   aluminum-magnesium hydroxide-simethicone (MAG-AL PLUS) 200-200-20 mg per 5 mL oral liquid (30 mL Oral Given 04/27/23 2224)     And   hyoscyamine (HYOSYNE) 0.125 mg per 5 mL oral liquid (10 mL Oral Given 04/27/23 2225)     And   lidocaine (XYLOCAINE) 2% oral topical viscous solution (15 mL Oral Given 04/27/23 2225)   pantoprazole (PROTONIX) 4 mg/mL injection (40 mg Intravenous Given 04/27/23 2224)     Clinical Impression   UTI (urinary tract infection) (Primary)   DDD (degenerative disc disease), lumbar   Lower back pain   Atypical chest pain   Gastroesophageal reflux disease, unspecified whether esophagitis present       Disposition: Discharged

## 2023-04-27 NOTE — ED Nurses Note (Signed)
Into pt's room to discharge pt, pt states after antibiotic she has developed mid sternal chest pain. Provider made aware, See MAR.

## 2023-04-27 NOTE — Discharge Instructions (Addendum)
May need mri if not improving. Return if any concern

## 2023-04-27 NOTE — ED Nurses Note (Signed)
Patient discharged home with friend.  AVS reviewed with patient/care giver.  A written copy of the AVS and discharge instructions was given to the patient/care giver. Scripts handed to patient/care giver. Questions sufficiently answered as needed.  Patient/care giver encouraged to follow up with PCP as indicated.  In the event of an emergency, patient/care giver instructed to call 911 or go to the nearest emergency room.

## 2023-04-28 DIAGNOSIS — R9431 Abnormal electrocardiogram [ECG] [EKG]: Secondary | ICD-10-CM

## 2023-04-28 DIAGNOSIS — R079 Chest pain, unspecified: Secondary | ICD-10-CM

## 2023-04-28 DIAGNOSIS — R001 Bradycardia, unspecified: Secondary | ICD-10-CM

## 2023-04-28 LAB — ECG 12 LEAD
Atrial Rate: 50 {beats}/min
Calculated P Axis: 84 degrees
Calculated R Axis: -20 degrees
Calculated T Axis: 50 degrees
PR Interval: 138 ms
QRS Duration: 82 ms
QT Interval: 430 ms
QTC Calculation: 392 ms
Ventricular rate: 50 {beats}/min

## 2023-04-29 ENCOUNTER — Encounter (HOSPITAL_BASED_OUTPATIENT_CLINIC_OR_DEPARTMENT_OTHER): Payer: Self-pay

## 2023-04-29 ENCOUNTER — Emergency Department (HOSPITAL_BASED_OUTPATIENT_CLINIC_OR_DEPARTMENT_OTHER): Payer: Commercial Managed Care - PPO

## 2023-04-29 ENCOUNTER — Other Ambulatory Visit: Payer: Self-pay

## 2023-04-29 ENCOUNTER — Emergency Department
Admission: EM | Admit: 2023-04-29 | Discharge: 2023-04-29 | Disposition: A | Discharge status: Home / Self Care | Payer: Commercial Managed Care - PPO | Source: Home / Self Care

## 2023-04-29 DIAGNOSIS — M47816 Spondylosis without myelopathy or radiculopathy, lumbar region: Secondary | ICD-10-CM | POA: Insufficient documentation

## 2023-04-29 DIAGNOSIS — F0390 Unspecified dementia without behavioral disturbance: Secondary | ICD-10-CM | POA: Insufficient documentation

## 2023-04-29 DIAGNOSIS — N390 Urinary tract infection, site not specified: Secondary | ICD-10-CM

## 2023-04-29 DIAGNOSIS — M51379 Other intervertebral disc degeneration, lumbosacral region without mention of lumbar back pain or lower extremity pain: Secondary | ICD-10-CM | POA: Insufficient documentation

## 2023-04-29 DIAGNOSIS — M4180 Other forms of scoliosis, site unspecified: Secondary | ICD-10-CM | POA: Insufficient documentation

## 2023-04-29 LAB — COMPREHENSIVE METABOLIC PANEL, NON-FASTING
ALBUMIN/GLOBULIN RATIO: 0.9 (ref 0.8–1.4)
ALBUMIN: 3.3 g/dL — ABNORMAL LOW (ref 3.4–5.0)
ALKALINE PHOSPHATASE: 193 U/L — ABNORMAL HIGH (ref 46–116)
ALT (SGPT): 46 U/L (ref <=78)
ANION GAP: 6 mmol/L (ref 4–13)
AST (SGOT): 27 U/L (ref 15–37)
BILIRUBIN TOTAL: 0.2 mg/dL (ref 0.2–1.0)
BUN/CREA RATIO: 19
BUN: 18 mg/dL (ref 7–18)
CALCIUM, CORRECTED: 9.3 mg/dL
CALCIUM: 8.7 mg/dL (ref 8.5–10.1)
CHLORIDE: 105 mmol/L (ref 98–107)
CO2 TOTAL: 31 mmol/L (ref 21–32)
CREATININE: 0.93 mg/dL (ref 0.55–1.02)
ESTIMATED GFR: 66 mL/min/{1.73_m2} (ref >59)
GLOBULIN: 3.8
GLUCOSE: 99 mg/dL (ref 74–106)
OSMOLALITY, CALCULATED: 285 mosm/kg (ref 270–290)
POTASSIUM: 4.2 mmol/L (ref 3.5–5.1)
PROTEIN TOTAL: 7.1 g/dL (ref 6.4–8.2)
SODIUM: 142 mmol/L (ref 136–145)

## 2023-04-29 LAB — CBC WITH DIFF
BASOPHIL #: 0.02 10*3/uL (ref 0.00–0.30)
BASOPHIL %: 0 % (ref 0–3)
EOSINOPHIL #: 0.04 10*3/uL (ref 0.00–0.80)
EOSINOPHIL %: 1 % (ref 1–7)
HCT: 41.7 % (ref 37.0–47.0)
HGB: 13.4 g/dL (ref 12.5–16.0)
LYMPHOCYTE #: 2.39 10*3/uL (ref 1.10–5.00)
LYMPHOCYTE %: 32 % (ref 25–45)
MCH: 29.8 pg (ref 27.0–32.0)
MCHC: 32.2 g/dL (ref 32.0–36.0)
MCV: 92.5 fL (ref 78.0–99.0)
MONOCYTE #: 0.61 10*3/uL (ref 0.00–1.30)
MONOCYTE %: 8 % (ref 0–12)
MPV: 8.3 fL (ref 7.4–10.4)
NEUTROPHIL #: 4.47 10*3/uL (ref 1.80–8.40)
NEUTROPHIL %: 59 % (ref 40–76)
PLATELETS: 208 10*3/uL (ref 140–440)
RBC: 4.51 10*6/uL (ref 4.20–5.40)
RDW: 16 % — ABNORMAL HIGH (ref 11.6–14.8)
WBC: 7.5 10*3/uL (ref 4.0–10.5)

## 2023-04-29 LAB — LACTIC ACID LEVEL W/ REFLEX FOR LEVEL >2.0: LACTIC ACID: 1.2 mmol/L (ref 0.4–2.0)

## 2023-04-29 MED ORDER — METHYLPREDNISOLONE SOD SUCC 125 MG SOLUTION FOR INJECTION WRAPPER
125.0000 mg | INTRAVENOUS | Status: DC
Start: 2023-04-29 — End: 2023-04-29

## 2023-04-29 MED ORDER — METHYLPREDNISOLONE SOD SUCC 125 MG SOLUTION FOR INJECTION WRAPPER
INTRAVENOUS | Status: AC
Start: 2023-04-29 — End: 2023-04-29
  Filled 2023-04-29: qty 2

## 2023-04-29 MED ORDER — PREDNISONE 20 MG TABLET
20.0000 mg | ORAL_TABLET | Freq: Every day | ORAL | 0 refills | Status: AC
Start: 2023-04-29 — End: 2023-05-04

## 2023-04-29 NOTE — ED Nurses Note (Signed)
Patient complains of lower back pain that radiates to left flank and to left lower abdominal. She also states that the pain has been radiating down her left leg. Additionally, patient states she was seen in this ED on Thursday 04/27/2023 and was diagnosed with a UTI and degenerative spinal changes. She states she was proscribed baclofen, macrobid and ultram and denies relief of pain or symptoms.

## 2023-04-29 NOTE — Discharge Instructions (Signed)
Drink plenty of fluids. Continue any at home medications as previously prescribed. Take medications that are prescribed from today's visit as prescribed. Discuss any questions you may have concerning your medications with your pharmacist. Follow up with your regular PCP in the next 2-3 days. Return to the ED if symptoms worsen, change, or do not improve.

## 2023-04-29 NOTE — ED Triage Notes (Signed)
Patient reports abdominal pain, flank pain,and back pain. Larey Seat about two weeks ago but did not get hurt. Pain had gotten better until today, took medications that were prescribed but started having same sx again today.

## 2023-04-29 NOTE — ED Provider Notes (Signed)
Kimberly Pruitt  ED Primary Provider Note      Name: Kimberly Pruitt  Age and Gender: 72 y.o. female  Date of Birth: 24-Jan-1951  MRN: [id number]  PCP: Mariah Milling, DO    CC:  Chief Complaint   Patient presents with    Back Pain       HPI:  Kimberly Pruitt is a 72 y.o. White female who presents to the ER with back pain.  Patient states that she was seen approximately 2 days ago and was diagnosed with degenerative disc disease along with a urinary tract infection.  She was given tramadol, Macrobid, baclofen and picked them up yesterday.  She reports that the pain was controlled ever since she left the ER but this morning it it increased.  She states that she did take her medicine, however there is conflicting statements regarding whether she took her medicine or not.  Patient denies any chest pain, shortness of breath, nausea vomiting or diarrhea.  Denies any loss of bowel or bladder.    Below pertinent information reviewed with patient:  Past Medical History:   Diagnosis Date    Essential hypertension      No Known Allergies    Past Surgical History:   Procedure Laterality Date    HX KNEE SURGERY          Social History     Socioeconomic History    Marital status: Divorced   Tobacco Use    Smoking status: Never    Smokeless tobacco: Never   Vaping Use    Vaping status: Never Used   Substance and Sexual Activity    Alcohol use: Never    Drug use: Never       ROS:  No other overt positive review of systems are noted other than stated in the HPI.      Objective:    ED Triage Vitals [04/29/23 1351]   BP (Non-Invasive) (!) 171/80   Heart Rate 71   Respiratory Rate 19   Temperature 37 C (98.6 F)   SpO2 98 %   Weight 72.6 kg (160 lb)   Height 1.702 m (5\' 7" )     Filed Vitals:    04/29/23 1351 04/29/23 1605   BP: (!) 171/80 (!) 165/88   Pulse: 71 68   Resp: 19 20   Temp: 37 C (98.6 F) 37.1 C (98.8 F)   SpO2: 98% 97%       Nursing notes and vital signs reviewed.    Constitutional - No acute  distress.  Alert and Active.  HEENT - Normocephalic. Atraumatic. PERRL. EOMI. Conjunctiva clear. . Moist mucous membranes.   Neck - Trachea midline. No stridor. No hoarseness.  Cardiac - Regular rate and rhythm. No murmurs, rubs, or gallops. Intact distal pulses.  Respiratory/Chest - Normal respiratory effort. Clear to auscultation bilaterally. No rales, wheezes or rhonchi. No chest tenderness.  Abdomen - Normal bowel sounds. Non-tender, soft, non-distended. No rebound or guarding.   Musculoskeletal - Good AROM. No muscle or joint tenderness appreciated. No clubbing, cyanosis or edema.  Skin - Warm and dry, without any rashes or other lesions.  Neuro - Alert and oriented x 3. Cranial nerves II-XII are grossly intact.  Moving all extremities symmetrically. Normal gait.  Psych - Normal mood and affect. Behavior is normal          Any pertinent labs and imaging obtained during this encounter reviewed below in MDM.    MDM/ED  Course:    Medical Decision Making  Patient presents to the ER with back pain.  Patient states that she was seen approximately 2 days ago and was diagnosed with degenerative disc disease along with a urinary tract infection.  She was given tramadol, Macrobid, baclofen and picked them up yesterday.  She reports that the pain was controlled ever since she left the ER but this morning it it increased.  She states that she did take her medicine, however there is conflicting statements regarding whether she took her medicine or not.  Patient denies any chest pain, shortness of breath, nausea vomiting or diarrhea.  Denies any loss of bowel or bladder.  Differentials include but not limited to DDD, coccyx fracture, urinary tract infection.  CBC CMP unremarkable.  I reviewed the urine culture which was negative.  I discussed with the patient family member to stop the Macrobid.  Family member states that she was not taking the baclofen.  I recommended taking the tramadol only at night and we will give her a  burst of steroids.  Family member states that she will get her into see her primary care doctor.  She reports that she is getting ready to go move with her sign, she was recently diagnosed with dementia and started on Namenda.  The niece has been trying to help with her medications.  The risk of medication overuse is present so she will keep her medications and bring them over to her.  Patient and family members all agreeable to treatment course.  Patient is stable and suitable for discharge.    Amount and/or Complexity of Data Reviewed  Labs: ordered. Decision-making details documented in ED Course.  Radiology: ordered. Decision-making details documented in ED Course.  ECG/medicine tests: independent interpretation performed.    Risk  Prescription drug management.          ED Course as of 04/29/23 2054   Sat Apr 29, 2023   1533 XR LUMBAR SPINE AP AND LAT  Normal lumbar vertebral heights.  No evidence of fracture.  Advanced multilevel disc space narrowing with endplate osteophytes.  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     1558 CBC/DIFF(!)  Unremarkable   1601 COMPREHENSIVE METABOLIC PANEL, NON-FASTING(!)  Unremarkable   1601 LACTIC ACID: 1.2       Orders Placed This Encounter    XR LUMBAR SPINE AP AND LAT    CBC/DIFF    COMPREHENSIVE METABOLIC PANEL, NON-FASTING    LACTIC ACID LEVEL W/ REFLEX FOR LEVEL >2.0    CBC WITH DIFF    predniSONE (DELTASONE) 20 mg Oral Tablet       Impression:   Clinical Impression   DDD (degenerative disc disease), lumbosacral (Primary)       Disposition: Discharged    / M. Toney Sang, APRN, FNP-C 04/29/2023, 15:03  [facility]  Department of Emergency Medicine  [facility]    Portions of this note may have been dictated using voice recognition software.     -----------------------  Results for orders placed or performed during the hospital encounter of 04/29/23 (from the past 12 hour(s))   COMPREHENSIVE METABOLIC PANEL, NON-FASTING   Result  Value Ref Range    SODIUM 142 136 - 145 mmol/L    POTASSIUM 4.2 3.5 - 5.1 mmol/L    CHLORIDE 105 98 - 107 mmol/L    CO2 TOTAL 31 21 - 32 mmol/L    ANION GAP 6 4 - 13 mmol/L    BUN 18  7 - 18 mg/dL    CREATININE 7.32 2.02 - 1.02 mg/dL    BUN/CREA RATIO 19     ESTIMATED GFR 66 >59 mL/min/1.21m^2    ALBUMIN 3.3 (L) 3.4 - 5.0 g/dL    CALCIUM 8.7 8.5 - 54.2 mg/dL    GLUCOSE 99 74 - 706 mg/dL    ALKALINE PHOSPHATASE 193 (H) 46 - 116 U/L    ALT (SGPT) 46 <=78 U/L    AST (SGOT) 27 15 - 37 U/L    BILIRUBIN TOTAL 0.2 0.2 - 1.0 mg/dL    PROTEIN TOTAL 7.1 6.4 - 8.2 g/dL    ALBUMIN/GLOBULIN RATIO 0.9 0.8 - 1.4    OSMOLALITY, CALCULATED 285 270 - 290 mOsm/kg    CALCIUM, CORRECTED 9.3 mg/dL    GLOBULIN 3.8    LACTIC ACID LEVEL W/ REFLEX FOR LEVEL >2.0   Result Value Ref Range    LACTIC ACID 1.2 0.4 - 2.0 mmol/L   CBC WITH DIFF   Result Value Ref Range    WBC 7.5 4.0 - 10.5 x10^3/uL    RBC 4.51 4.20 - 5.40 x10^6/uL    HGB 13.4 12.5 - 16.0 g/dL    HCT 23.7 62.8 - 31.5 %    MCV 92.5 78.0 - 99.0 fL    MCH 29.8 27.0 - 32.0 pg    MCHC 32.2 32.0 - 36.0 g/dL    RDW 17.6 (H) 16.0 - 14.8 %    PLATELETS 208 140 - 440 x10^3/uL    MPV 8.3 7.4 - 10.4 fL    NEUTROPHIL % 59 40 - 76 %    LYMPHOCYTE % 32 25 - 45 %    MONOCYTE % 8 0 - 12 %    EOSINOPHIL % 1 1 - 7 %    BASOPHIL % 0 0 - 3 %    NEUTROPHIL # 4.47 1.80 - 8.40 x10^3/uL    LYMPHOCYTE # 2.39 1.10 - 5.00 x10^3/uL    MONOCYTE # 0.61 0.00 - 1.30 x10^3/uL    EOSINOPHIL # 0.04 0.00 - 0.80 x10^3/uL    BASOPHIL # 0.02 0.00 - 0.30 x10^3/uL     XR LUMBAR SPINE AP AND LAT   Final Result   ADVANCED DEGENERATIVE CHANGES OF THE LUMBAR SPINE.               Radiologist location ID: [id number]

## 2023-04-29 NOTE — ED Nurses Note (Signed)
Patient discharged home with family.  AVS reviewed with patient/care giver.  A written copy of the AVS and discharge instructions was given to the patient/care giver. Scripts escribed to preferred pharmacy. Questions sufficiently answered as needed.  Patient/care giver encouraged to follow up with PCP as indicated.  In the event of an emergency, patient/care giver instructed to call 911 or go to the nearest emergency room.

## 2023-04-30 LAB — URINE CULTURE,ROUTINE: URINE CULTURE: 10000 — AB

## 2023-05-02 IMAGING — MG 3D SCREENING MAMMO BIL AND TOMO
3 series · 7 of 24 positions shown · non-contrast
Comparison: This is a baseline evaluation.

------------- REPORT [id number] -------------
[facility]
[address]
We wish to report the following on your recent mammography examination. We are sending a report to your referring physician or other health care provider.
INDICATION: Screening mammogram.  Asymptomatic 72-year-old with no family history.  Lifetime breast cancer risk 4.3%.

[R CC tomo · right · 0.10mm/px · 4 of 5 slices shown]
[im 1/5]
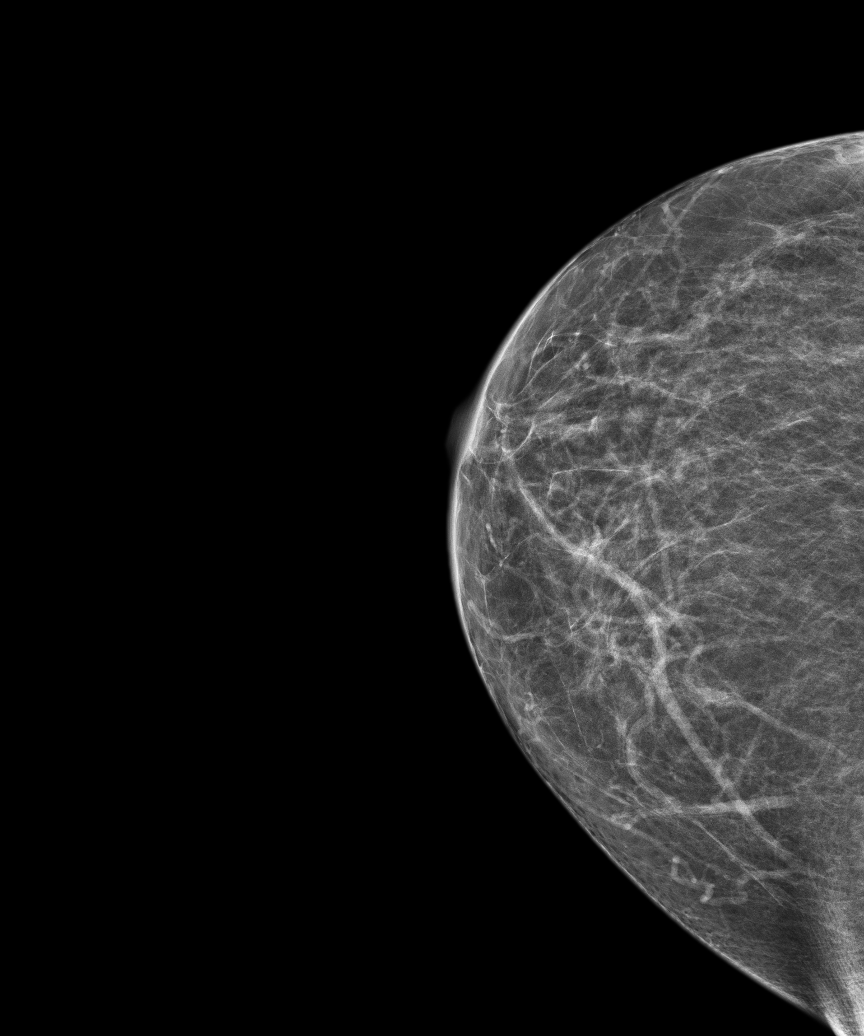
[im 2/5]
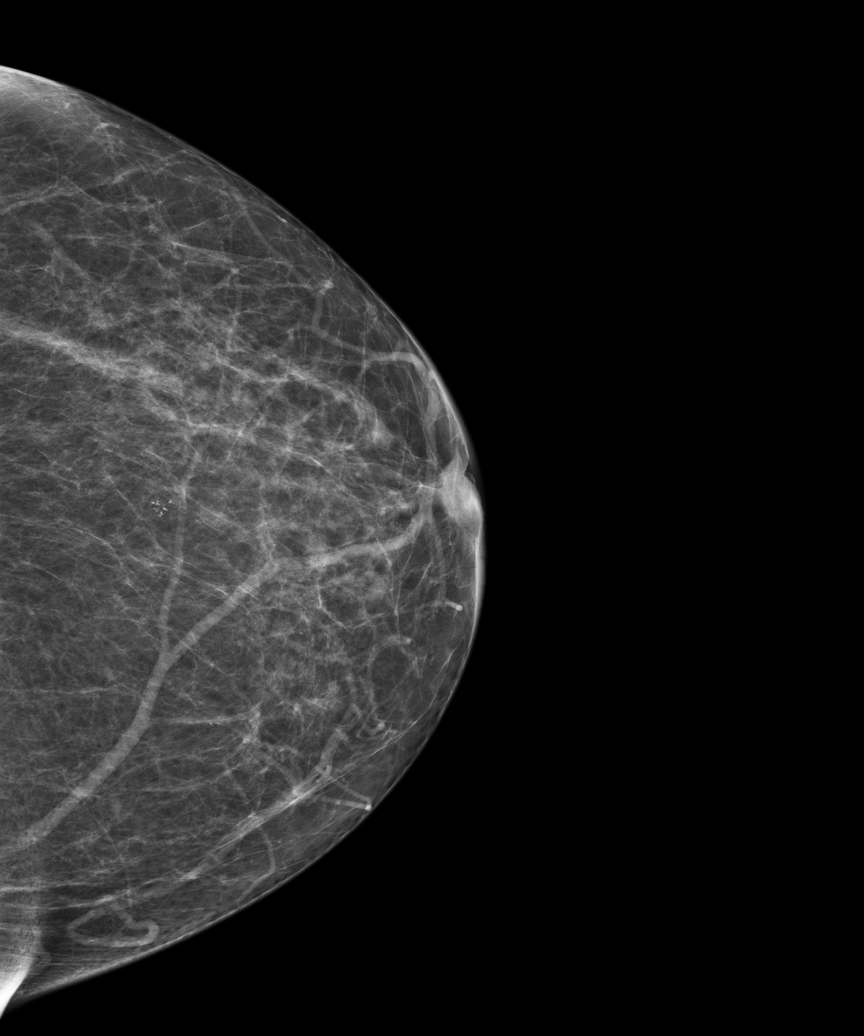
[im 3/5]
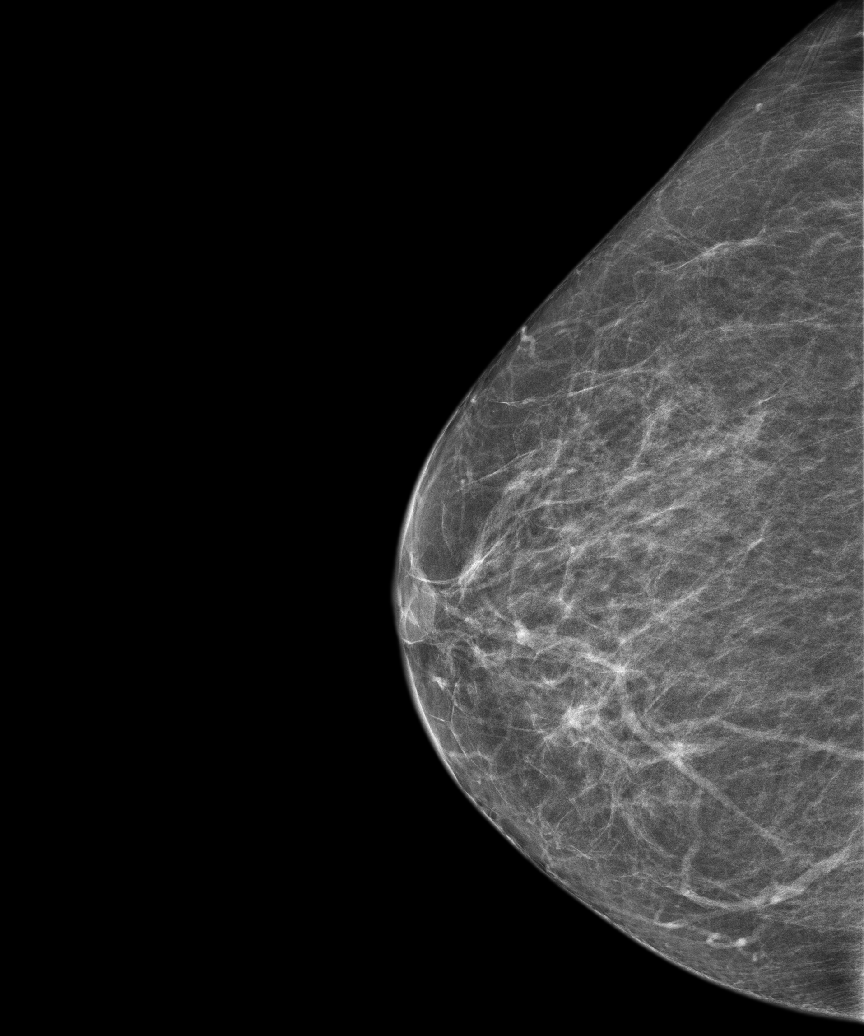
[im 5/5]
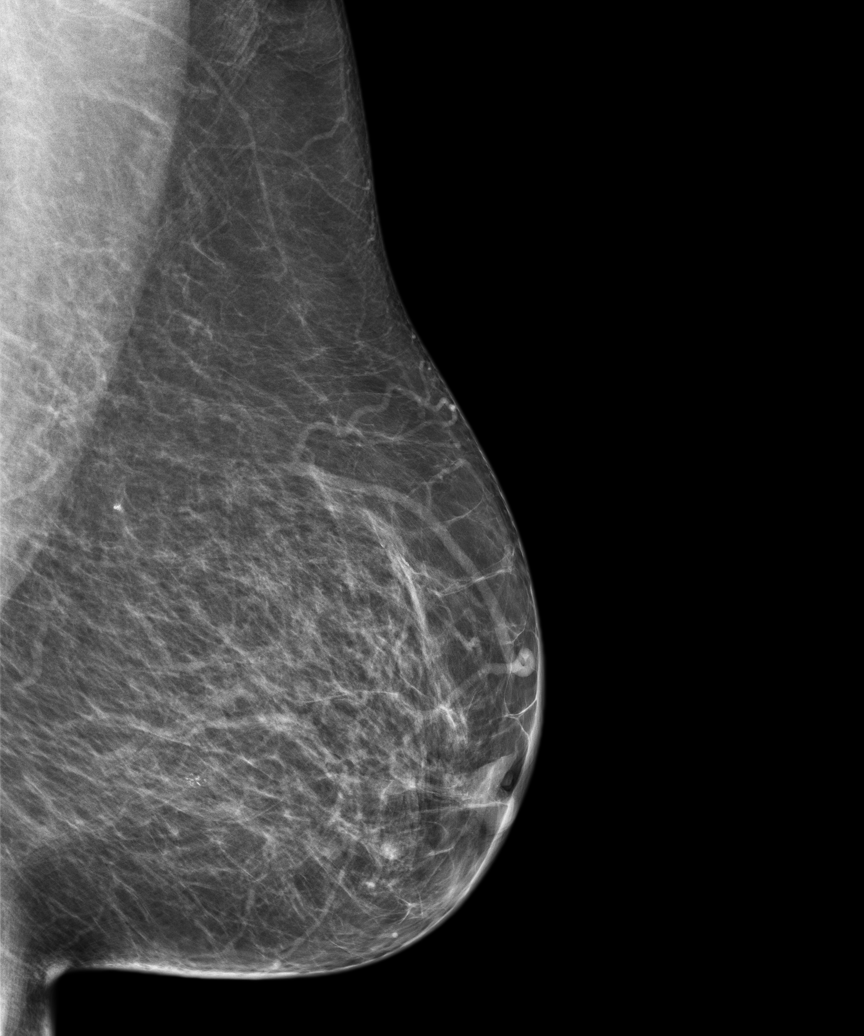

[3D SCREENING MAMMO BIL AND TOMO tomo · 2 acquisitions, 2 frames shown (1 of 2)]
[im 1/2]
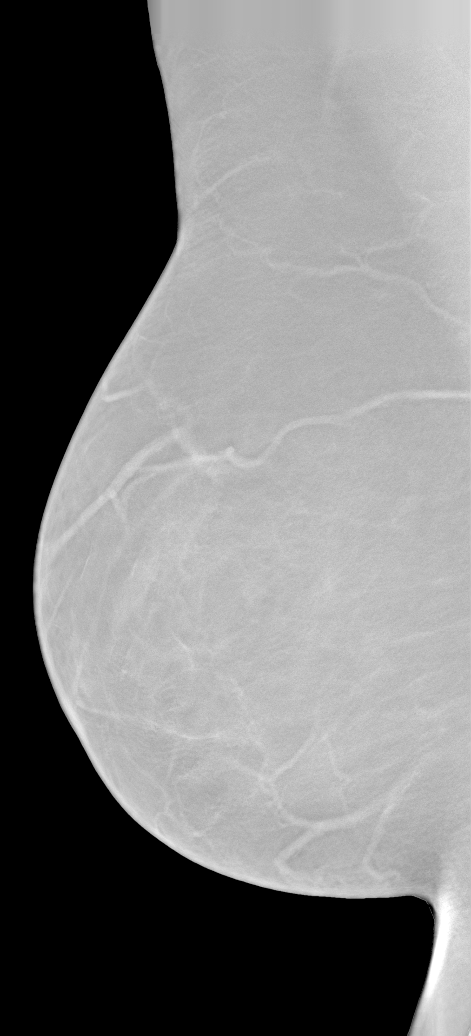
[im 2/2]
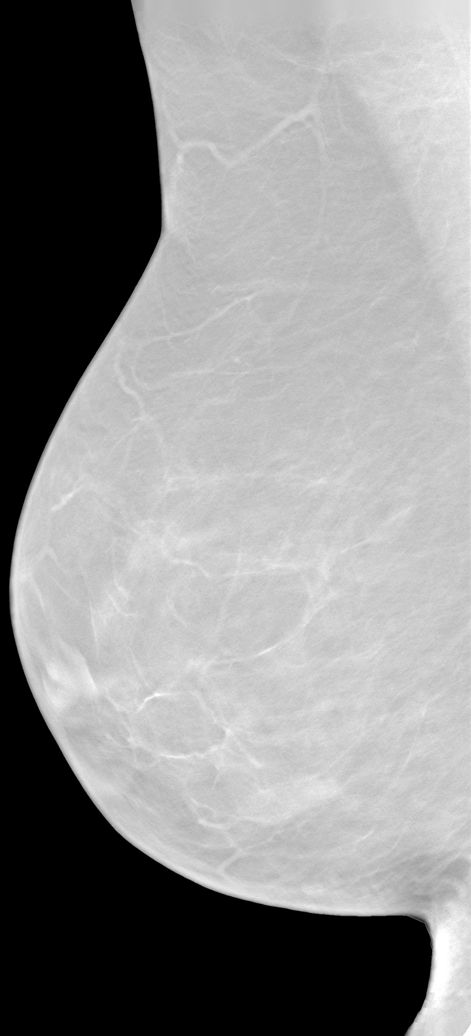

[3D SCREENING MAMMO BIL AND TOMO tomo (2 of 2) · tomo slice 10/58.0]
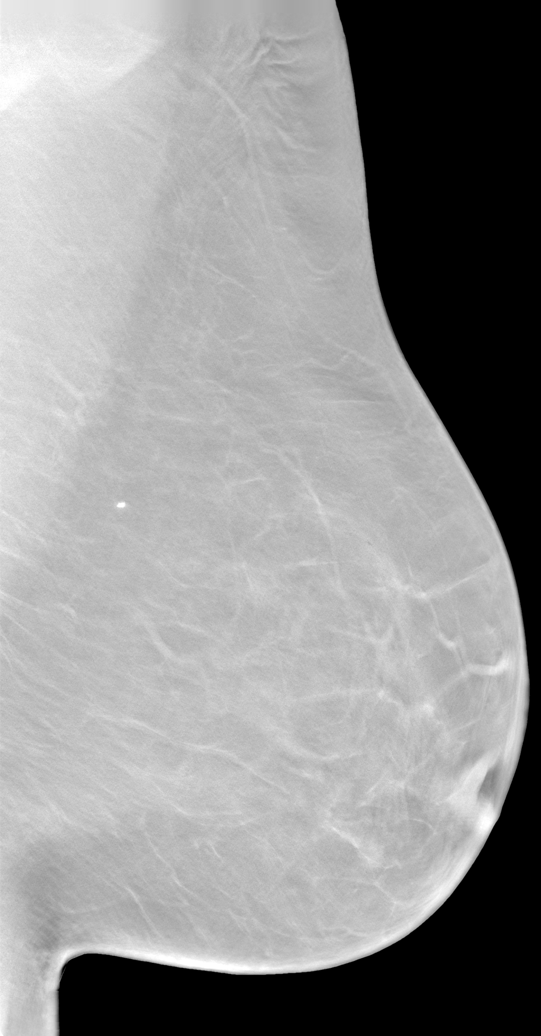

[7 of 24 positions shown; findings below may reference images not displayed]

FINDING: Abnormal

There is a finding on your mammogram that requires further tests for a more thorough evaluation. You should contact your referring physician as soon as possible (if you have not already done so).

This statement is mandated by the [place], Department of Health.
Your examination was performed by one of our technologists, who are registered radiological technologists and also specially certified in mammography:
___
Tack, Jezdec (M)

Your mammogram was interpreted by our radiologist.

( 
Chonweng Frois, M.D.

(Annual Breast Examination by a physician or other health care provider
(Annual Mammography Screening beginning at age 40
(Monthly Breast Self Examination

------------- REPORT [id number] -------------
﻿

EXAM:  3D SCREENING MAMMO BIL AND TOMO
FINDINGS: No focal abnormalities are seen in the right breast.  

On the left side, there is a group of microcalcifications in the central inferior left breast.  No soft tissue mass, architectural change or duct dilatation is seen.
IMPRESSION: 1.  Presence of a group of microcalcifications with suspected pleomorphism in the central inferior left breast.  Since this is a baseline evaluation, further evaluation with magnification views of this microcalcific cluster is recommended. 

2.  BIRADS 0-Need additional imaging evaluation.

3.  DENSITY CODE –  B (Scattered areas of fibroglandular density) 

Final Assessment Code:

BI-RADS 0
 Need additional imaging evaluation.

BI-RADS 1
 Negative mammogram.

BI-RADS 2
 Benign finding.

BI-RADS 3
 Probably benign finding; short-interval follow-up suggested.

BI-RADS 4
 Suspicious abnormality; biopsy should be considered.

BI-RADS 5
 Highly suggestive of malignancy; appropriate action should be taken.

BI-RADS 6
 Known biopsy-proven malignancy; appropriate action should be taken.

NOTE:
In compliance with Federal regulations, the results of this mammogram are being sent to the patient.
